# Patient Record
Sex: Female | Born: 1990 | Race: White | Hispanic: Yes | Marital: Married | Smoking: Never smoker
Health system: Southern US, Community
[De-identification: ages and names within clinical notes are randomized; demographics above are authoritative.]

## PROBLEM LIST (undated history)

## (undated) DIAGNOSIS — J45909 Unspecified asthma, uncomplicated: Secondary | ICD-10-CM

## (undated) HISTORY — PX: APPENDECTOMY: SHX54

## (undated) HISTORY — DX: Unspecified asthma, uncomplicated: J45.909

---

## 2017-11-13 ENCOUNTER — Emergency Department: Payer: Self-pay

## 2017-11-13 ENCOUNTER — Other Ambulatory Visit: Payer: Self-pay

## 2017-11-13 ENCOUNTER — Emergency Department
Admission: EM | Admit: 2017-11-13 | Discharge: 2017-11-14 | Disposition: A | Discharge status: Home / Self Care | Payer: Self-pay | Attending: Emergency Medicine | Admitting: Emergency Medicine

## 2017-11-13 DIAGNOSIS — I309 Acute pericarditis, unspecified: Secondary | ICD-10-CM | POA: Insufficient documentation

## 2017-11-13 DIAGNOSIS — I3139 Other pericardial effusion (noninflammatory): Secondary | ICD-10-CM

## 2017-11-13 DIAGNOSIS — I313 Pericardial effusion (noninflammatory): Secondary | ICD-10-CM

## 2017-11-13 LAB — BASIC METABOLIC PANEL
ANION GAP: 10 (ref 5–15)
BUN: 16 mg/dL (ref 6–20)
CO2: 21 mmol/L — AB (ref 22–32)
Calcium: 9.6 mg/dL (ref 8.9–10.3)
Chloride: 106 mmol/L (ref 98–111)
Creatinine, Ser: 0.86 mg/dL (ref 0.44–1.00)
Glucose, Bld: 111 mg/dL — ABNORMAL HIGH (ref 70–99)
Potassium: 3.4 mmol/L — ABNORMAL LOW (ref 3.5–5.1)
SODIUM: 137 mmol/L (ref 135–145)

## 2017-11-13 LAB — HEPATIC FUNCTION PANEL
ALBUMIN: 4.9 g/dL (ref 3.5–5.0)
ALK PHOS: 63 U/L (ref 38–126)
ALT: 31 U/L (ref 0–44)
AST: 33 U/L (ref 15–41)
Bilirubin, Direct: 0.3 mg/dL — ABNORMAL HIGH (ref 0.0–0.2)
Indirect Bilirubin: 2.4 mg/dL — ABNORMAL HIGH (ref 0.3–0.9)
TOTAL PROTEIN: 8.8 g/dL — AB (ref 6.5–8.1)
Total Bilirubin: 2.7 mg/dL — ABNORMAL HIGH (ref 0.3–1.2)

## 2017-11-13 LAB — CBC
HEMATOCRIT: 42.6 % (ref 35.0–47.0)
HEMOGLOBIN: 14.8 g/dL (ref 12.0–16.0)
MCH: 32.1 pg (ref 26.0–34.0)
MCHC: 34.7 g/dL (ref 32.0–36.0)
MCV: 92.4 fL (ref 80.0–100.0)
Platelets: 260 10*3/uL (ref 150–440)
RBC: 4.61 MIL/uL (ref 3.80–5.20)
RDW: 13.4 % (ref 11.5–14.5)
WBC: 9.1 10*3/uL (ref 3.6–11.0)

## 2017-11-13 LAB — LIPASE, BLOOD: LIPASE: 31 U/L (ref 11–51)

## 2017-11-13 LAB — TROPONIN I: Troponin I: <0.03 ng/mL (ref <0.03)

## 2017-11-13 MED ORDER — GI COCKTAIL ~~LOC~~
30.0000 mL | Freq: Once | ORAL | Status: AC
  Administered 2017-11-13: 30 mL via ORAL
  Filled 2017-11-13: qty 30

## 2017-11-13 MED ORDER — FENTANYL CITRATE (PF) 100 MCG/2ML IJ SOLN
75.0000 ug | Freq: Once | INTRAMUSCULAR | Status: AC
  Administered 2017-11-14: 75 ug via INTRAVENOUS
  Filled 2017-11-13: qty 2

## 2017-11-13 MED ORDER — SUCRALFATE 1 G PO TABS
1.0000 g | ORAL_TABLET | Freq: Once | ORAL | Status: AC
  Administered 2017-11-13: 1 g via ORAL
  Filled 2017-11-13: qty 1

## 2017-11-13 NOTE — ED Triage Notes (Signed)
Through the Spanish interpretor, patient states she started yesterday with chest pain associated with N/V and shortness of breath. She has not vomited but felt like she has to. Patient is alert and oriented and able to answer questions without difficulty

## 2017-11-13 NOTE — ED Provider Notes (Signed)
[facility] Emergency Department Provider Note  ____________________________________________   First MD Initiated Contact with Patient 11/13/17 2315     (approximate)  I have reviewed the triage vital signs and the nursing notes.   HISTORY  Chief Complaint Chest Pain    HPI Tamara Leach is a 27 y.o. female who presents to the emergency department with roughly 24 hours of chest pain, abdominal cramping, and 10 episodes of loose watery stools.  The symptoms seem to begin shortly after taking an over-the-counter weight loss supplement called Demograss.   Her chest pain is moderate severity aching in her left upper chest.  Worse when lying flat improves when sitting up associated with some shortness of breath.  She is also had cramping lower abdominal pain with 10 loose stools.  No recent antibiotics.  No fevers or chills.  No recent travel.  She has no leg swelling.  No history of DVT or pulmonary embolism.  No recent upper respiratory tract infections.  No history of tuberculosis.   History reviewed. No pertinent past medical history.  There are no active problems to display for this patient.   History reviewed. No pertinent surgical history.  Prior to Admission medications   Medication Sig Start Date End Date Taking? Authorizing Provider  ibuprofen (ADVIL,MOTRIN) 600 MG tablet Take 1 tablet (600 mg total) by mouth every 8 (eight) hours as needed. 11/14/17   Merrily Brittle, MD    Allergies Patient has no known allergies.  No family history on file.  Social History Social History   Tobacco Use  . Smoking status: Not on file  Substance Use Topics  . Alcohol use: Not on file  . Drug use: Not on file    Review of Systems Constitutional: No fever/chills Eyes: No visual changes. ENT: No sore throat. Cardiovascular: Positive for chest pain. Respiratory: Positive for shortness of breath. Gastrointestinal: Positive for abdominal pain.   No nausea, no vomiting.  Positive for diarrhea.  No constipation. Genitourinary: Negative for dysuria. Musculoskeletal: Negative for back pain. Skin: Negative for rash. Neurological: Negative for headaches, focal weakness or numbness.   ____________________________________________   PHYSICAL EXAM:  VITAL SIGNS: ED Triage Vitals  Enc Vitals Group     BP 11/13/17 2213 111/78     Pulse Rate 11/13/17 2230 81     Resp 11/13/17 2213 18     Temp 11/13/17 2213 98.9 F (37.2 C)     Temp src --      SpO2 11/13/17 2213 100 %     Weight 11/13/17 2214 188 lb (85.3 kg)     Height 11/13/17 2214 5\' 4"  (1.626 m)     Head Circumference --      Peak Flow --      Pain Score 11/13/17 2213 10     Pain Loc --      Pain Edu? --      Excl. in GC? --     Constitutional: Alert and oriented x4 appears uncomfortable holding her chest nontoxic no diaphoresis speaks full clear sentences Eyes: PERRL EOMI. Head: Atraumatic. Nose: No congestion/rhinnorhea. Mouth/Throat: No trismus Neck: No stridor.   Cardiovascular: Normal rate, regular rhythm. Grossly normal heart sounds.  Good peripheral circulation. Respiratory: Normal respiratory effort.  No retractions. Lungs CTAB and moving good air Gastrointestinal: Obese soft nontender Musculoskeletal: No lower extremity edema   Neurologic:  Normal speech and language. No gross focal neurologic deficits are appreciated. Skin:  Skin is warm, dry and intact.  No rash noted.  Specifically no zoster rash noted Psychiatric: Mood and affect are normal. Speech and behavior are normal.    ____________________________________________   DIFFERENTIAL includes but not limited to  Pericarditis, myocarditis, acute coronary syndrome, aortic dissection, pneumothorax, pulmonary embolism ____________________________________________   LABS (all labs ordered are listed, but only abnormal results are displayed)  Labs Reviewed  BASIC METABOLIC PANEL - Abnormal; Notable for  the following components:      Result Value   Potassium 3.4 (*)    CO2 21 (*)    Glucose, Bld 111 (*)    All other components within normal limits  HEPATIC FUNCTION PANEL - Abnormal; Notable for the following components:   Total Protein 8.8 (*)    Total Bilirubin 2.7 (*)    Bilirubin, Direct 0.3 (*)    Indirect Bilirubin 2.4 (*)    All other components within normal limits  CBC  TROPONIN I  LIPASE, BLOOD  HCG, QUANTITATIVE, PREGNANCY  TROPONIN I  URINALYSIS, COMPLETE (UACMP) WITH MICROSCOPIC  POC URINE PREG, ED    Lab work reviewed by me with no signs of acute ischemia x2 __________________________________________  EKG  ED ECG REPORT I, Merrily BrittleNeil Keira Bohlin, the attending physician, personally viewed and interpreted this ECG.  Date: 11/14/2017 EKG Time:  Rate: 84 Rhythm: normal sinus rhythm QRS Axis: Rightward axis Intervals: normal ST/T Wave abnormalities: normal Narrative Interpretation: no evidence of acute ischemia.  S1Q3T3 with some concern for right heart strain  ____________________________________________  RADIOLOGY  Chest x-ray reviewed by me with no acute disease CT chest reviewed by me with no evidence of pulmonary embolism but does suggest a small pericardial effusion ____________________________________________   PROCEDURES  Procedure(s) performed: no  Procedures  Critical Care performed: no  ____________________________________________   INITIAL IMPRESSION / ASSESSMENT AND PLAN / ED COURSE  Pertinent labs & imaging results that were available during my care of the patient were reviewed by me and considered in my medical decision making (see chart for details).        ----------------------------------------- 11:58 PM on 11/13/2017 -----------------------------------------  The patient's symptoms are unchanged after sucralfate and a GI cocktail.  She does use birth control and she is morbidly obese raising the probability of pulmonary  embolism.  As if no other clear source of her symptoms we will give her IV fentanyl 75 mcg and CT her chest.  2-hour troponin is well now. ____________________________________________  ----------------------------------------- 1:47 AM on 11/14/2017 -----------------------------------------  The patient CT scan is negative for pulmonary embolism but does show a small pericardial effusion.  Given 50 mg of Toradol with complete resolution of her symptoms.  On further questioning the patient does say her chest pain is significantly worsened with lying flat improves while sitting up.  Her EKG is not diagnostic for pericarditis however her history and pericardial effusion are so we will treat her with 1 week of nonsteroidals and refer her to cardiology as an outpatient.  Strict return precautions have been given and the patient verbalizes understanding and agreement with the plan.  FINAL CLINICAL IMPRESSION(S) / ED DIAGNOSES  Final diagnoses:  Sullivan LoneGilbert syndrome  Pericardial effusion  Acute pericarditis, unspecified type      NEW MEDICATIONS STARTED DURING THIS VISIT:  New Prescriptions   IBUPROFEN (ADVIL,MOTRIN) 600 MG TABLET    Take 1 tablet (600 mg total) by mouth every 8 (eight) hours as needed.     Note:  This document was prepared using Dragon voice recognition software and may include unintentional dictation  errors.     Merrily Brittle, MD 11/14/17 2201

## 2017-11-13 NOTE — ED Notes (Signed)
Information obtained via Lynelle Smokemaria, stratus interpreter.

## 2017-11-14 ENCOUNTER — Encounter: Payer: Self-pay | Admitting: Radiology

## 2017-11-14 ENCOUNTER — Emergency Department: Payer: Self-pay

## 2017-11-14 LAB — HCG, QUANTITATIVE, PREGNANCY: hCG, Beta Chain, Quant, S: <1 m[IU]/mL (ref <5)

## 2017-11-14 LAB — TROPONIN I: Troponin I: <0.03 ng/mL (ref <0.03)

## 2017-11-14 MED ORDER — IOHEXOL 350 MG/ML SOLN
75.0000 mL | Freq: Once | INTRAVENOUS | Status: AC | PRN
  Administered 2017-11-14: 75 mL via INTRAVENOUS

## 2017-11-14 MED ORDER — KETOROLAC TROMETHAMINE 30 MG/ML IJ SOLN
15.0000 mg | Freq: Once | INTRAMUSCULAR | Status: AC
  Administered 2017-11-14: 15 mg via INTRAVENOUS
  Filled 2017-11-14: qty 1

## 2017-11-14 MED ORDER — IBUPROFEN 600 MG PO TABS: 600.0000 mg | ORAL_TABLET | Freq: Three times a day (TID) | ORAL | 0 refills | Status: AC | PRN

## 2017-11-14 NOTE — Discharge Instructions (Signed)
Please take ibuprofen 3 times a day for the next week and make an appointment to follow-up with the cardiologist for reevaluation.  Return to the emergency department sooner for any concerns whatsoever.  It was a pleasure to take care of you today, and thank you for coming to our emergency department.  If you have any questions or concerns before leaving please ask the nurse to grab me and I'm more than happy to go through your aftercare instructions again.  If you were prescribed any opioid pain medication today such as Norco, Vicodin, Percocet, morphine, hydrocodone, or oxycodone please make sure you do not drive when you are taking this medication as it can alter your ability to drive safely.  If you have any concerns once you are home that you are not improving or are in fact getting worse before you can make it to your follow-up appointment, please do not hesitate to call 911 and come back for further evaluation.  Merrily BrittleNeil Yerick Eggebrecht, MD  Results for orders placed or performed during the hospital encounter of 11/13/17  Basic metabolic panel  Result Value Ref Range   Sodium 137 135 - 145 mmol/L   Potassium 3.4 (L) 3.5 - 5.1 mmol/L   Chloride 106 98 - 111 mmol/L   CO2 21 (L) 22 - 32 mmol/L   Glucose, Bld 111 (H) 70 - 99 mg/dL   BUN 16 6 - 20 mg/dL   Creatinine, Ser 4.090.86 0.44 - 1.00 mg/dL   Calcium 9.6 8.9 - 81.110.3 mg/dL   GFR calc non Af Amer >60 >60 mL/min   GFR calc Af Amer >60 >60 mL/min   Anion gap 10 5 - 15  CBC  Result Value Ref Range   WBC 9.1 3.6 - 11.0 K/uL   RBC 4.61 3.80 - 5.20 MIL/uL   Hemoglobin 14.8 12.0 - 16.0 g/dL   HCT 91.442.6 78.235.0 - 95.647.0 %   MCV 92.4 80.0 - 100.0 fL   MCH 32.1 26.0 - 34.0 pg   MCHC 34.7 32.0 - 36.0 g/dL   RDW 21.313.4 08.611.5 - 57.814.5 %   Platelets 260 150 - 440 K/uL  Troponin I  Result Value Ref Range   Troponin I <0.03 <0.03 ng/mL  Hepatic function panel  Result Value Ref Range   Total Protein 8.8 (H) 6.5 - 8.1 g/dL   Albumin 4.9 3.5 - 5.0 g/dL   AST 33  15 - 41 U/L   ALT 31 0 - 44 U/L   Alkaline Phosphatase 63 38 - 126 U/L   Total Bilirubin 2.7 (H) 0.3 - 1.2 mg/dL   Bilirubin, Direct 0.3 (H) 0.0 - 0.2 mg/dL   Indirect Bilirubin 2.4 (H) 0.3 - 0.9 mg/dL  Lipase, blood  Result Value Ref Range   Lipase 31 11 - 51 U/L  hCG, quantitative, pregnancy  Result Value Ref Range   hCG, Beta Chain, Quant, S <1 <5 mIU/mL  Troponin I  Result Value Ref Range   Troponin I <0.03 <0.03 ng/mL   Dg Chest 2 View  Result Date: 11/13/2017 CLINICAL DATA:  Chest pain EXAM: CHEST - 2 VIEW COMPARISON:  None. FINDINGS: Normal heart size and mediastinal contours. No acute infiltrate or edema. No effusion or pneumothorax. No acute osseous findings. IMPRESSION: Negative chest. Electronically Signed   By: Marnee SpringJonathon  Watts M.D.   On: 11/13/2017 23:03   Ct Angio Chest Pe W/cm &/or Wo Cm  Result Date: 11/14/2017 CLINICAL DATA:  PE suspected, high pretest prob. Chest pain and  shortness of breath, onset yesterday. EXAM: CT ANGIOGRAPHY CHEST WITH CONTRAST TECHNIQUE: Multidetector CT imaging of the chest was performed using the standard protocol during bolus administration of intravenous contrast. Multiplanar CT image reconstructions and MIPs were obtained to evaluate the vascular anatomy. CONTRAST:  75mL OMNIPAQUE IOHEXOL 350 MG/ML SOLN COMPARISON:  Chest radiographs yesterday. FINDINGS: Cardiovascular: There are no filling defects within the pulmonary arteries to suggest pulmonary embolus. Thoracic aorta is normal in caliber without dissection or acute aortic abnormality. Heart is normal in size. Small pericardial effusion with fluid in the superior pericardial recess. Mediastinum/Nodes: No enlarged mediastinal or hilar lymph nodes. No visualized thyroid nodule. Esophagus is decompressed. Lungs/Pleura: Mild subsegmental atelectasis in the left greater than right lower lobe. No consolidative airspace disease. No pleural effusion or evidence pulmonary edema. The trachea and mainstem  bronchi are patent. No pulmonary mass. Upper Abdomen: No acute findings. Elongated left lobe of the liver extending into the left upper quadrant, partially included. Musculoskeletal: There are no acute or suspicious osseous abnormalities. Review of the MIP images confirms the above findings. IMPRESSION: 1. No pulmonary embolus. 2. Small pericardial effusion. Electronically Signed   By: Rubye Oaks M.D.   On: 11/14/2017 01:09

## 2018-04-20 DIAGNOSIS — L732 Hidradenitis suppurativa: Secondary | ICD-10-CM | POA: Insufficient documentation

## 2018-04-20 LAB — HM HIV SCREENING LAB: HM HIV Screening: NEGATIVE

## 2018-04-20 LAB — HM PAP SMEAR: HM Pap smear: NEGATIVE

## 2018-08-26 ENCOUNTER — Emergency Department
Admission: EM | Admit: 2018-08-26 | Discharge: 2018-08-26 | Disposition: A | Discharge status: Home / Self Care | Payer: Self-pay | Attending: Emergency Medicine | Admitting: Emergency Medicine

## 2018-08-26 ENCOUNTER — Other Ambulatory Visit: Payer: Self-pay

## 2018-08-26 ENCOUNTER — Emergency Department: Payer: Self-pay

## 2018-08-26 DIAGNOSIS — N200 Calculus of kidney: Secondary | ICD-10-CM | POA: Insufficient documentation

## 2018-08-26 LAB — URINALYSIS, COMPLETE (UACMP) WITH MICROSCOPIC
Bacteria, UA: NONE SEEN
Bilirubin Urine: NEGATIVE
Glucose, UA: NEGATIVE mg/dL
Hgb urine dipstick: NEGATIVE
Ketones, ur: NEGATIVE mg/dL
Leukocytes,Ua: NEGATIVE
Nitrite: NEGATIVE
Protein, ur: NEGATIVE mg/dL
Specific Gravity, Urine: 1.011 (ref 1.005–1.030)
pH: 7 (ref 5.0–8.0)

## 2018-08-26 LAB — POCT PREGNANCY, URINE: Preg Test, Ur: NEGATIVE

## 2018-08-26 MED ORDER — MELOXICAM 15 MG PO TABS: 15.0000 mg | ORAL_TABLET | Freq: Every day | ORAL | 1 refills | Status: AC

## 2018-08-26 MED ORDER — KETOROLAC TROMETHAMINE 30 MG/ML IJ SOLN
30.0000 mg | Freq: Once | INTRAMUSCULAR | Status: AC
  Administered 2018-08-26: 21:00:00 30 mg via INTRAMUSCULAR
  Filled 2018-08-26: qty 1

## 2018-08-26 MED ORDER — METHOCARBAMOL 500 MG PO TABS: 500.0000 mg | ORAL_TABLET | Freq: Three times a day (TID) | ORAL | 0 refills | Status: AC | PRN

## 2018-08-26 MED ORDER — TAMSULOSIN HCL 0.4 MG PO CAPS: 0.4000 mg | ORAL_CAPSULE | Freq: Every day | ORAL | 0 refills | Status: AC

## 2018-08-26 MED ORDER — METHOCARBAMOL 500 MG PO TABS
1000.0000 mg | ORAL_TABLET | Freq: Once | ORAL | Status: AC
  Administered 2018-08-26: 1000 mg via ORAL
  Filled 2018-08-26: qty 2

## 2018-08-26 NOTE — ED Provider Notes (Signed)
[facility] Emergency Department Provider Note  ____________________________________________  Time seen: Approximately 7:52 PM  I have reviewed the triage vital signs and the nursing notes.   HISTORY  Chief Complaint Back Pain    HPI Tamara Leach is a 28 y.o. female presents to the emergency department with left sided low back pain for 1 day in the absence of falls or traumas.  Patient rates her pain at 10 out of 10 intensity and states it does not radiate.  Patient states that she thought that she was running fever last night.  She denies dysuria or hematuria.  She has had a history of gallstones but denies a history of nephrolithiasis.  She denies a history of pyelonephritis or recent UTI.  No changes in vaginal discharge or dyspareunia.  Patient states that she has had similar episodes of back pain in the past that have resolved without intervention.  No other alleviating measures have been attempted.        No past medical history on file.  There are no active problems to display for this patient.   No past surgical history on file.  Prior to Admission medications   Medication Sig Start Date End Date Taking? Authorizing Provider  ibuprofen (ADVIL,MOTRIN) 600 MG tablet Take 1 tablet (600 mg total) by mouth every 8 (eight) hours as needed. 11/14/17   Merrily Brittle, MD  meloxicam (MOBIC) 15 MG tablet Take 1 tablet (15 mg total) by mouth daily for 7 days. 08/26/18 09/02/18  Orvil Feil, PA-C  methocarbamol (ROBAXIN) 500 MG tablet Take 1 tablet (500 mg total) by mouth every 8 (eight) hours as needed for up to 5 days. 08/26/18 08/31/18  Orvil Feil, PA-C  tamsulosin (FLOMAX) 0.4 MG CAPS capsule Take 1 capsule (0.4 mg total) by mouth daily for 14 days. 08/26/18 09/09/18  Orvil Feil, PA-C    Allergies Patient has no known allergies.  No family history on file.  Social History Social History   Tobacco Use  . Smoking status: Not on  file  Substance Use Topics  . Alcohol use: Not on file  . Drug use: Not on file     Review of Systems  Constitutional: Patient reports tactile fever.  Eyes: No visual changes. No discharge ENT: No upper respiratory complaints. Cardiovascular: no chest pain. Respiratory: no cough. No SOB. Gastrointestinal: No abdominal pain.  No nausea, no vomiting.  No diarrhea.  No constipation. Genitourinary: Negative for dysuria. No hematuria Musculoskeletal: Patient has low back pain.  Skin: Negative for rash, abrasions, lacerations, ecchymosis. Neurological: Negative for headaches, focal weakness or numbness.   ____________________________________________   PHYSICAL EXAM:  VITAL SIGNS: ED Triage Vitals  Enc Vitals Group     BP 08/26/18 1917 (!) 114/92     Pulse Rate 08/26/18 1916 72     Resp --      Temp 08/26/18 1916 98.5 F (36.9 C)     Temp Source 08/26/18 1916 Oral     SpO2 08/26/18 1917 96 %     Weight 08/26/18 1919 206 lb (93.4 kg)     Height --      Head Circumference --      Peak Flow --      Pain Score 08/26/18 1918 8     Pain Loc --      Pain Edu? --      Excl. in GC? --      Constitutional: Alert and oriented. Well appearing and  in no acute distress. Eyes: Conjunctivae are normal. PERRL. EOMI. Head: Atraumatic. Cardiovascular: Normal rate, regular rhythm. Normal S1 and S2.  Good peripheral circulation. Respiratory: Normal respiratory effort without tachypnea or retractions. Lungs CTAB. Good air entry to the bases with no decreased or absent breath sounds. Gastrointestinal: Bowel sounds 4 quadrants. Soft and nontender to palpation. No guarding or rigidity. No palpable masses. No distention.  Patient has CVA tenderness on the left. Musculoskeletal: Full range of motion to all extremities. No gross deformities appreciated. Neurologic:  Normal speech and language. No gross focal neurologic deficits are appreciated.  Skin:  Skin is warm, dry and intact. No rash  noted. Psychiatric: Mood and affect are normal. Speech and behavior are normal. Patient exhibits appropriate insight and judgement.   ____________________________________________   LABS (all labs ordered are listed, but only abnormal results are displayed)  Labs Reviewed  URINALYSIS, COMPLETE (UACMP) WITH MICROSCOPIC - Abnormal; Notable for the following components:      Result Value   Color, Urine YELLOW (*)    APPearance HAZY (*)    All other components within normal limits  POCT PREGNANCY, URINE   ____________________________________________  EKG   ____________________________________________  RADIOLOGY I personally viewed and evaluated these images as part of my medical decision making, as well as reviewing the written report by the radiologist.    Ct Renal Stone Study  Result Date: 08/26/2018 CLINICAL DATA:  28 year old female with acute flank and abdominal pain. EXAM: CT ABDOMEN AND PELVIS WITHOUT CONTRAST TECHNIQUE: Multidetector CT imaging of the abdomen and pelvis was performed following the standard protocol without IV contrast. COMPARISON:  None. FINDINGS: Please note that parenchymal abnormalities may be missed without intravenous contrast. Lower chest: No acute abnormality Hepatobiliary: Severe hepatic steatosis noted. The gallbladder is unremarkable. No biliary dilatation. Pancreas: Unremarkable Spleen: Unremarkable Adrenals/Urinary Tract: The kidneys, adrenal glands and bladder are unremarkable except for a nonobstructing 3 mm mid LEFT renal calculus. Stomach/Bowel: Stomach is within normal limits. Appendix appears normal. No evidence of bowel wall thickening, distention, or inflammatory changes. Vascular/Lymphatic: No significant vascular findings are present. No enlarged abdominal or pelvic lymph nodes. Reproductive: An IUD is noted.  No adnexal mass. Other: No ascites, focal collection or pneumoperitoneum. Musculoskeletal: No acute or significant osseous findings.  IMPRESSION: 1. No evidence of acute abnormality. 2. 3 mm nonobstructing LEFT renal calculus 3. Severe hepatic steatosis Electronically Signed   By: Harmon Pier M.D.   On: 08/26/2018 20:27    ____________________________________________    PROCEDURES  Procedure(s) performed:    Procedures    Medications  ketorolac (TORADOL) 30 MG/ML injection 30 mg (30 mg Intramuscular Given 08/26/18 2100)  methocarbamol (ROBAXIN) tablet 1,000 mg (1,000 mg Oral Given 08/26/18 2100)     ____________________________________________   INITIAL IMPRESSION / ASSESSMENT AND PLAN / ED COURSE  Pertinent labs & imaging results that were available during my care of the patient were reviewed by me and considered in my medical decision making (see chart for details).  Review of the [place] CSRS was performed in accordance of the [facility] prior to dispensing any controlled drugs.           Assessment and Plan:  Nephrolithiasis Back pain Patient presents to the emergency department with left-sided back pain for 1 day without dysuria, hematuria or increased urinary frequency.  On physical exam, patient has CVA tenderness on the left without abdominal pain, guarding or rigidity.  Differential diagnosis included pyelonephritis, cystitis, nephrolithiasis and lumbar strain.  Urinalysis revealed no concerning  findings of infection without leukocytes, nitrites or blood.  CT renal stone study revealed a 3 mm nonobstructing left-sided renal stone.  Patient was discharged with Flomax, Robaxin and meloxicam.  Patient education regarding nephrolithiasis was given.  Increase hydration at home was encouraged.  Patient was given strict return precautions to return to the emergency department for new or worsening symptoms.  All patient questions were answered.   ____________________________________________  FINAL CLINICAL IMPRESSION(S) / ED DIAGNOSES  Final diagnoses:  Nephrolithiasis      NEW MEDICATIONS STARTED  DURING THIS VISIT:  ED Discharge Orders         Ordered    tamsulosin (FLOMAX) 0.4 MG CAPS capsule  Daily     08/26/18 2118    methocarbamol (ROBAXIN) 500 MG tablet  Every 8 hours PRN     08/26/18 2118    meloxicam (MOBIC) 15 MG tablet  Daily     08/26/18 2118              This chart was dictated using voice recognition software/Dragon. Despite best efforts to proofread, errors can occur which can change the meaning. Any change was purely unintentional.    Orvil FeilWoods, Noor Vidales M, PA-C 08/26/18 2150    Nita SickleVeronese, [place], MD 08/26/18 [phone]

## 2018-08-26 NOTE — ED Triage Notes (Signed)
Back pain with dysuria and fever for 3 days.

## 2018-08-26 NOTE — ED Notes (Addendum)
Patient reports pain in lower back since yesterday. Patient further reports that she has had this pain before. She states that the pain is not the result of a fall. Patient is currently on her way for a CT scan.

## 2019-04-12 DIAGNOSIS — L732 Hidradenitis suppurativa: Secondary | ICD-10-CM

## 2019-04-13 ENCOUNTER — Other Ambulatory Visit: Payer: Self-pay

## 2019-04-13 ENCOUNTER — Ambulatory Visit: Payer: Self-pay | Admitting: Advanced Practice Midwife

## 2019-04-13 ENCOUNTER — Ambulatory Visit: Payer: Self-pay

## 2019-04-13 ENCOUNTER — Encounter: Payer: Self-pay | Admitting: Advanced Practice Midwife

## 2019-04-13 DIAGNOSIS — Z30430 Encounter for insertion of intrauterine contraceptive device: Secondary | ICD-10-CM | POA: Insufficient documentation

## 2019-04-13 DIAGNOSIS — Z113 Encounter for screening for infections with a predominantly sexual mode of transmission: Secondary | ICD-10-CM

## 2019-04-13 LAB — WET PREP FOR TRICH, YEAST, CLUE
Trichomonas Exam: NEGATIVE
Yeast Exam: NEGATIVE

## 2019-04-13 NOTE — Progress Notes (Addendum)
Wet mount reviewed and is negative today, so no treatment needed for wet mount per standing order. PCP list given to pt per pt request and provider order. Counseled pt per provider orders and pt states understanding. Pt reports she will call back to set up appt for physical. Provider orders completed.Ronny Bacon, RN

## 2019-04-13 NOTE — Progress Notes (Signed)
STI clinic/screening visit  Subjective:  Tamara Leach is a 28 y.o.G4P2 nonsmoker female being seen today for an STI screening visit. The patient reports they do have symptoms.  Patient reports that they do not desire a pregnancy in the next year.   They reported they are not interested in discussing contraception today.   Patient has the following medical conditions:   Patient Active Problem List   Diagnosis Date Noted  . Hidradenitis suppurativa 04/20/2018  . Morbid obesity (HCC) 04/20/2018     No chief complaint on file.   HPI  Patient reports voids and sees "things" in urine; left ear hurts x 1 week; LMP 02/15/19 with Paraguard inserted 07/25/18 Last sex 04/11/19 without condom.  2 sex partners in last 3 months.  See flowsheet for further details and programmatic requirements.    The following portions of the patient's history were reviewed and updated as appropriate: allergies, current medications, past medical history, past social history, past surgical history and problem list.  Objective:  There were no vitals filed for this visit.  Physical Exam Vitals signs and nursing note reviewed.  Constitutional:      Appearance: Normal appearance.  HENT:     Head: Normocephalic and atraumatic.     Mouth/Throat:     Mouth: Mucous membranes are moist.     Pharynx: Oropharynx is clear. No oropharyngeal exudate or posterior oropharyngeal erythema.  Pulmonary:     Effort: Pulmonary effort is normal.  Abdominal:     General: Abdomen is flat.     Palpations: Abdomen is soft. There is no mass.     Tenderness: There is no abdominal tenderness. There is no rebound.     Comments: Poor tone, soft without tenderness, increased adipose  Genitourinary:    General: Normal vulva.     Exam position: Lithotomy position.     Pubic Area: No rash or pubic lice.      Labia:        Right: No rash or lesion.        Left: No rash or lesion.      Vagina: Normal. No vaginal  discharge, erythema, bleeding or lesions.     Cervix: No cervical motion tenderness, discharge, friability, lesion or erythema.     Uterus: Normal.      Adnexa: Right adnexa normal and left adnexa normal.     Rectum: Normal.  Lymphadenopathy:     Head:     Right side of head: No preauricular or posterior auricular adenopathy.     Left side of head: No preauricular or posterior auricular adenopathy.     Cervical: No cervical adenopathy.     Upper Body:     Right upper body: No supraclavicular or axillary adenopathy.     Left upper body: No supraclavicular or axillary adenopathy.     Lower Body: No right inguinal adenopathy. No left inguinal adenopathy.  Skin:    General: Skin is warm and dry.     Findings: No rash.  Neurological:     Mental Status: She is alert and oriented to person, place, and time.       Assessment and Plan:  Tamara Leach is a 28 y.o. female presenting to the [facility] Department for STI screening  1. Screening examination for venereal disease Treat wet mount per standing orders Immunization nurse consult Please give primary care list to pt for her ear pain Last physical 04/2018--needs physical CBE due 2022 -  WET PREP FOR TRICH, YEAST, CLUE - Syphilis Serology, [facility]     No follow-ups on file.  No future appointments.  Herbie Saxon, CNM

## 2019-08-05 ENCOUNTER — Ambulatory Visit: Payer: Self-pay | Attending: Internal Medicine

## 2019-08-05 DIAGNOSIS — Z23 Encounter for immunization: Secondary | ICD-10-CM

## 2019-08-05 NOTE — Progress Notes (Signed)
   Covid-19 Vaccination Clinic  Name:  Clydette Privitera    MRN: [id number] DOB: [birth year]  08/05/2019  Ms. Morgan Keinath was observed post Covid-19 immunization for 15 minutes without incident. She was provided with Vaccine Information Sheet and instruction to access the V-Safe system.   Ms. Ahrianna Siglin was instructed to call 911 with any severe reactions post vaccine: Marland Kitchen Difficulty breathing  . Swelling of face and throat  . A fast heartbeat  . A bad rash all over body  . Dizziness and weakness   Immunizations Administered    Name Date Dose VIS Date Route   Pfizer COVID-19 Vaccine 08/05/2019  9:19 AM 0.3 mL 04/20/2019 Intramuscular   Manufacturer: [employer], [employer]   Lot: [id number]   NDC: [address]-5

## 2019-08-26 ENCOUNTER — Ambulatory Visit: Payer: Self-pay | Attending: Internal Medicine

## 2019-08-26 DIAGNOSIS — Z23 Encounter for immunization: Secondary | ICD-10-CM

## 2019-08-26 NOTE — Progress Notes (Signed)
   Covid-19 Vaccination Clinic  Name:  Tamara Leach    MRN: [id number] DOB: 11/22/1990  08/26/2019  Tamara Leach was observed post Covid-19 immunization for 15 minutes without incident. She was provided with Vaccine Information Sheet and instruction to access the V-Safe system.   Tamara Leach was instructed to call 911 with any severe reactions post vaccine: . Difficulty breathing  . Swelling of face and throat  . A fast heartbeat  . A bad rash all over body  . Dizziness and weakness   Immunizations Administered    Name Date Dose VIS Date Route   Pfizer COVID-19 Vaccine 08/26/2019  9:15 AM 0.3 mL 04/20/2019 Intramuscular   Manufacturer: Pfizer, Inc   Lot: [id number]   NDC: [address]-2     

## 2019-08-26 NOTE — Progress Notes (Signed)
   Covid-19 Vaccination Clinic  Name:  Shellene Sweigert    MRN: [id number] DOB: Dec 24, 1990  08/26/2019  Ms. Keaunna Skipper was observed post Covid-19 immunization for 15 minutes without incident. She was provided with Vaccine Information Sheet and instruction to access the V-Safe system.   Ms. Swayzee Wadley was instructed to call 911 with any severe reactions post vaccine: Marland Kitchen Difficulty breathing  . Swelling of face and throat  . A fast heartbeat  . A bad rash all over body  . Dizziness and weakness   Immunizations Administered    Name Date Dose VIS Date Route   Pfizer COVID-19 Vaccine 08/26/2019  9:15 AM 0.3 mL 04/20/2019 Intramuscular   Manufacturer: [employer], [employer]   Lot: [id number]   NDC: [address]-8

## 2020-07-07 IMAGING — CT CT RENAL STONE PROTOCOL
2 of 4 series · 17 of 46 positions shown, 19 images · non-contrast
Comparison: None.

CLINICAL DATA: 28-year-old female with acute flank and abdominal
pain.

EXAM:
CT ABDOMEN AND PELVIS WITHOUT CONTRAST
TECHNIQUE: Multidetector CT imaging of the abdomen and pelvis was performed
following the standard protocol without IV contrast.

[Series 2: stone full standard · axial · 0.82mm/px · z∈[-1258,-838]mm · 14 of 94 slices shown, 16 images]
[im 5/94  soft-tissue]
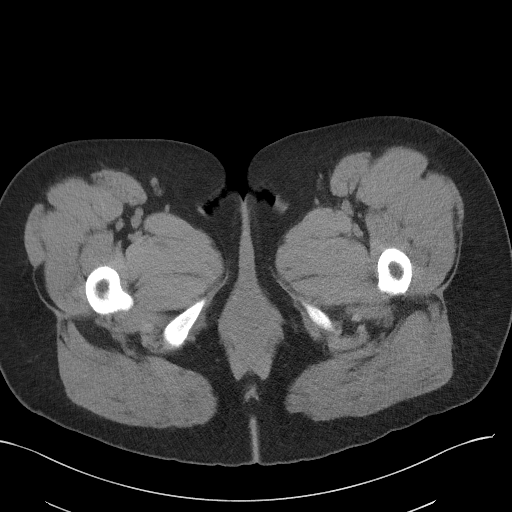
[im 5/94  bone]
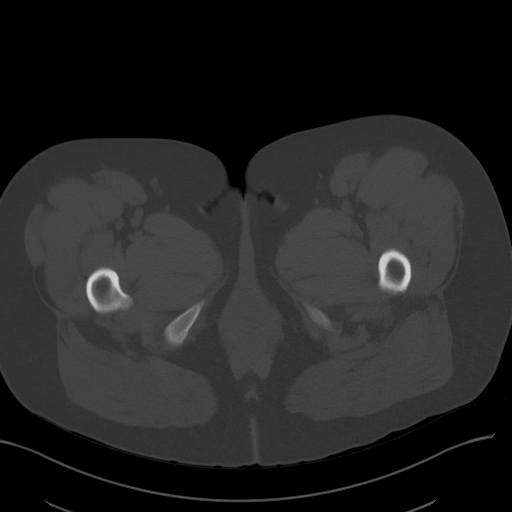
[im 13/94  soft-tissue]
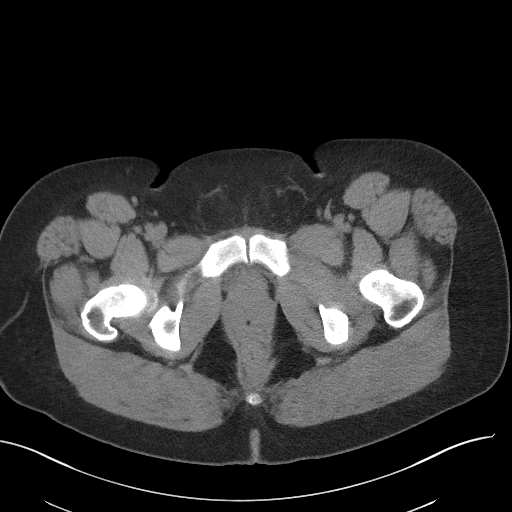
[im 17/94  soft-tissue]
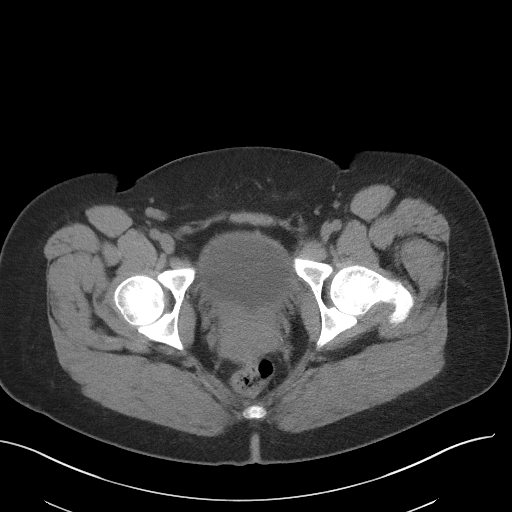
[im 25/94  soft-tissue]
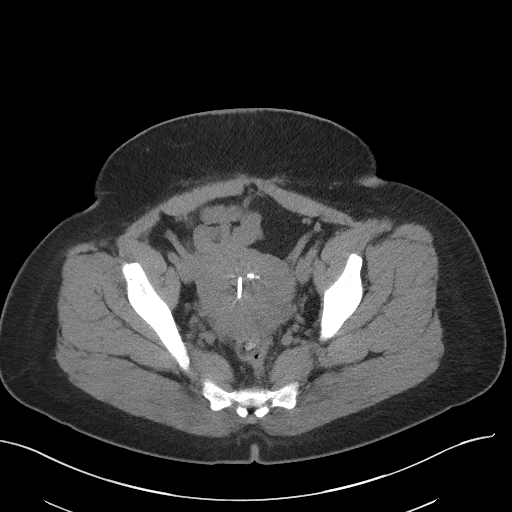
[im 33/94  soft-tissue]
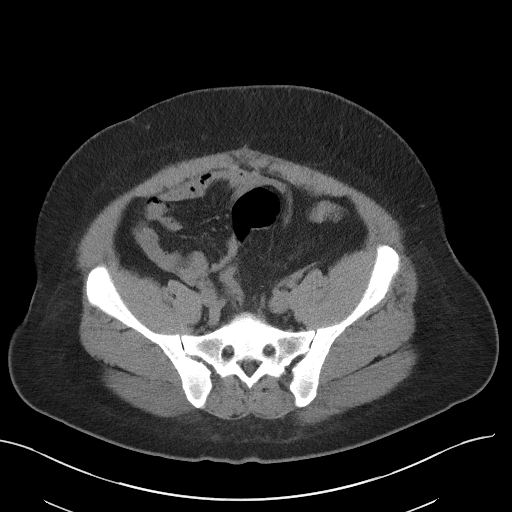
[im 37/94  soft-tissue]
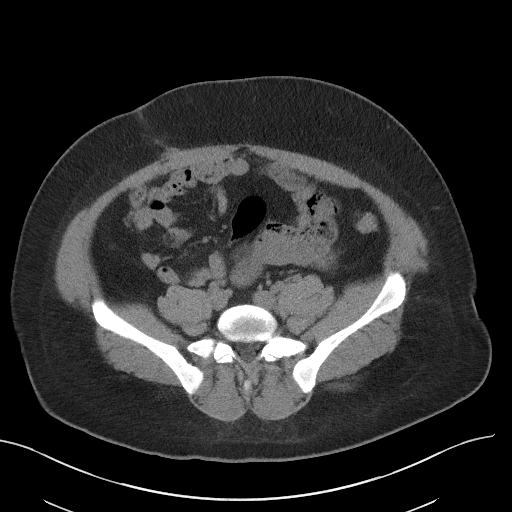
[im 45/94  soft-tissue]
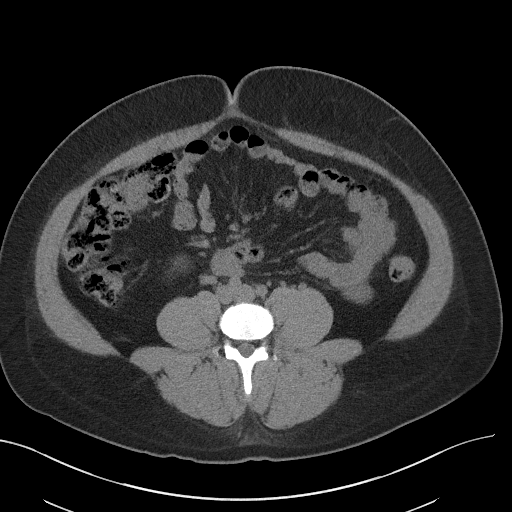
[im 49/94  soft-tissue]
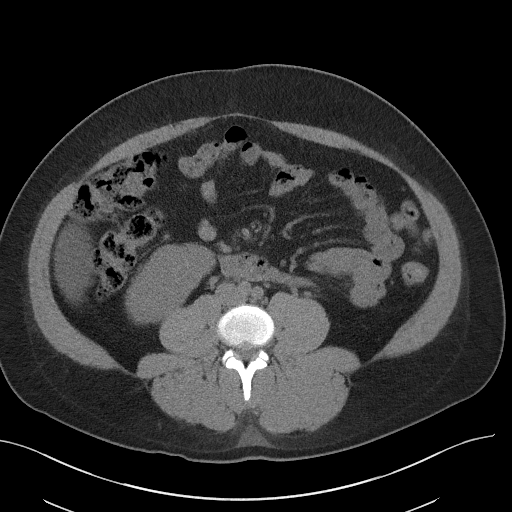
[im 57/94  soft-tissue]
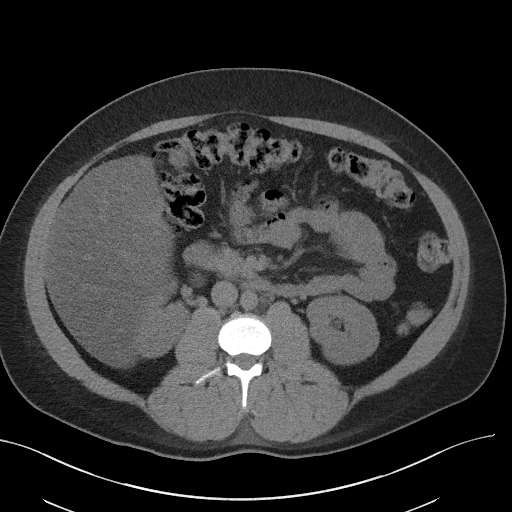
[im 57/94  bone]
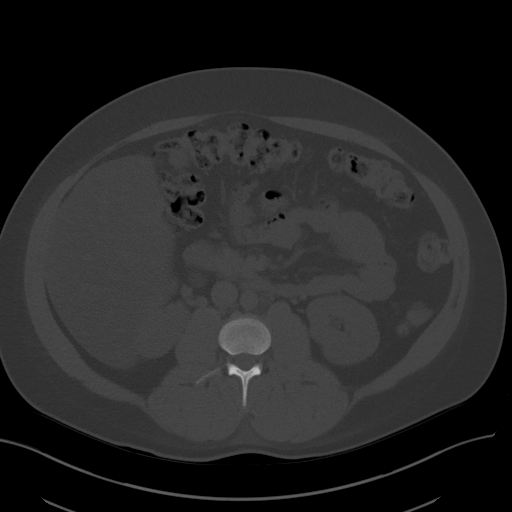
[im 61/94  soft-tissue]
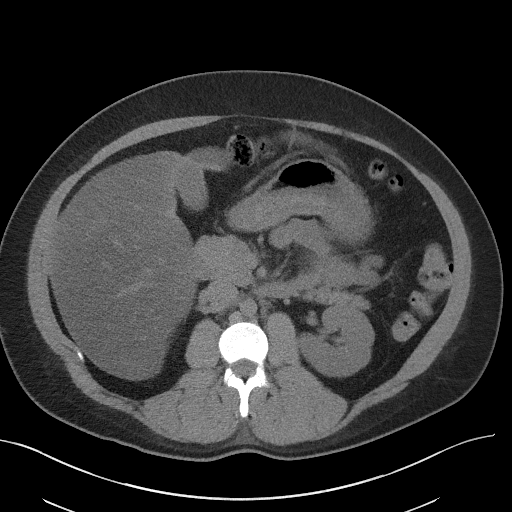
[im 69/94  soft-tissue]
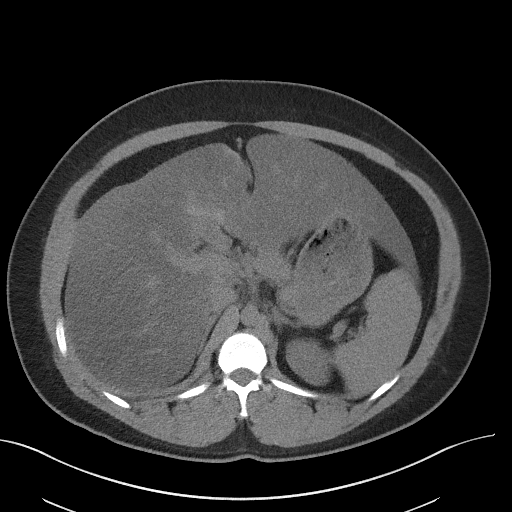
[im 77/94  soft-tissue]
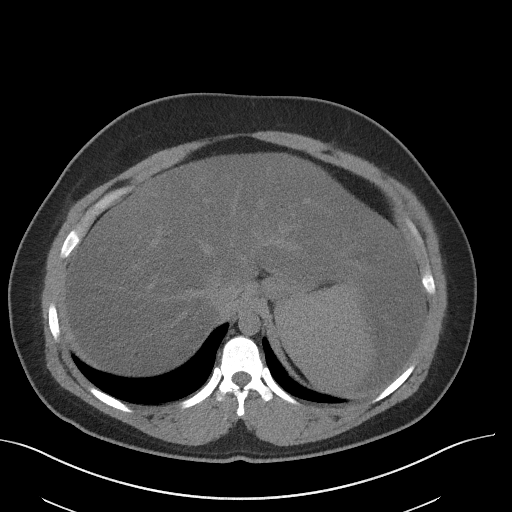
[im 81/94  soft-tissue]
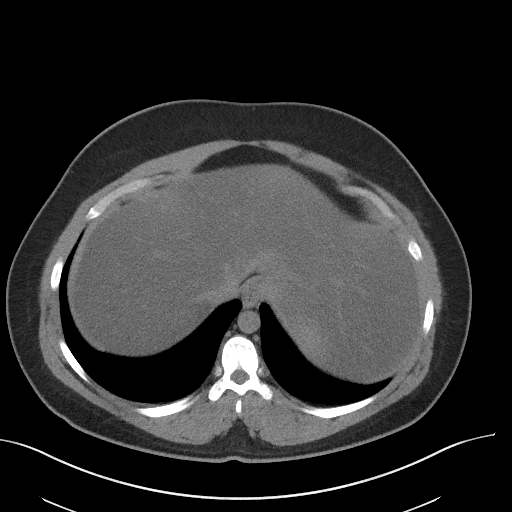
[im 89/94  soft-tissue]
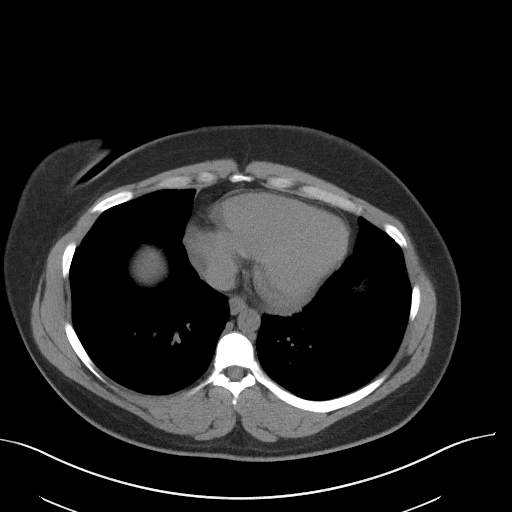

[Series 5: coronal · coronal · 0.82mm/px · 3 of 158 slices shown]
[im 53/158  soft-tissue]
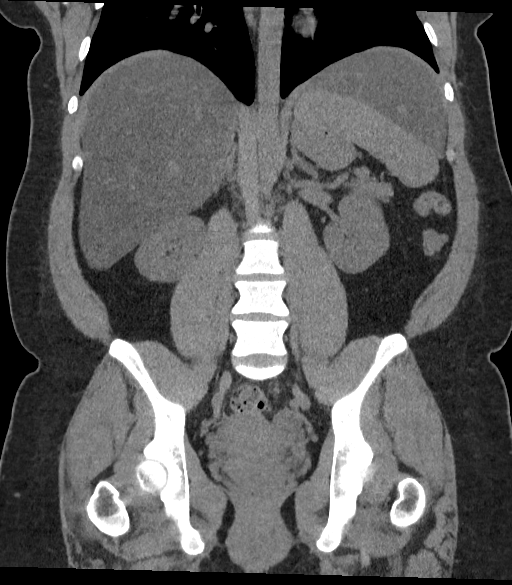
[im 70/158  soft-tissue]
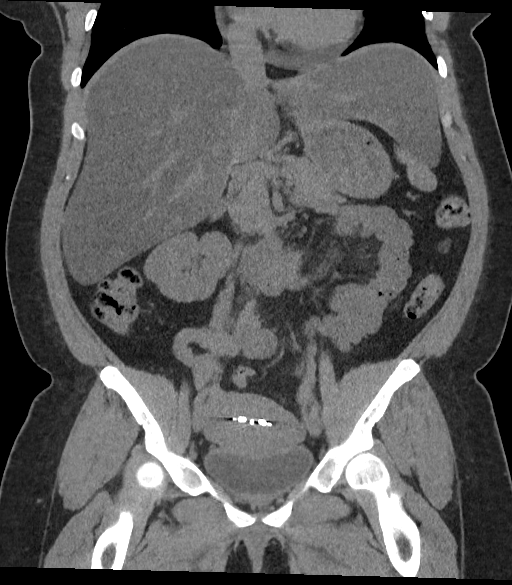
[im 88/158  soft-tissue]
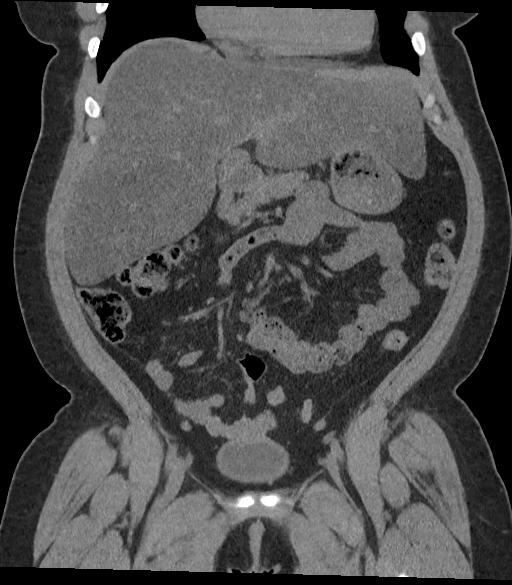

[17 of 46 positions shown; findings below may reference images not displayed]

FINDINGS: Please note that parenchymal abnormalities may be missed without
intravenous contrast.

Lower chest: No acute abnormality

Hepatobiliary: Severe hepatic steatosis noted. The gallbladder is
unremarkable. No biliary dilatation.

Pancreas: Unremarkable

Spleen: Unremarkable

Adrenals/Urinary Tract: The kidneys, adrenal glands and bladder are
unremarkable except for a nonobstructing 3 mm mid LEFT renal
calculus.

Stomach/Bowel: Stomach is within normal limits. Appendix appears
normal. No evidence of bowel wall thickening, distention, or
inflammatory changes.

Vascular/Lymphatic: No significant vascular findings are present. No
enlarged abdominal or pelvic lymph nodes.

Reproductive: An IUD is noted.  No adnexal mass.

Other: No ascites, focal collection or pneumoperitoneum.

Musculoskeletal: No acute or significant osseous findings.
IMPRESSION: 1. No evidence of acute abnormality.
2. 3 mm nonobstructing LEFT renal calculus
3. Severe hepatic steatosis

## 2021-06-25 ENCOUNTER — Ambulatory Visit: Payer: Self-pay

## 2021-07-02 ENCOUNTER — Other Ambulatory Visit: Payer: Self-pay

## 2021-07-02 ENCOUNTER — Encounter: Payer: Self-pay | Admitting: Physician Assistant

## 2021-07-02 ENCOUNTER — Ambulatory Visit (LOCAL_COMMUNITY_HEALTH_CENTER): Payer: Self-pay | Admitting: Physician Assistant

## 2021-07-02 VITALS — BP 104/68 | Ht 60.25 in | Wt 202.6 lb

## 2021-07-02 DIAGNOSIS — Z1331 Encounter for screening for depression: Secondary | ICD-10-CM

## 2021-07-02 DIAGNOSIS — Z01419 Encounter for gynecological examination (general) (routine) without abnormal findings: Secondary | ICD-10-CM

## 2021-07-02 DIAGNOSIS — L819 Disorder of pigmentation, unspecified: Secondary | ICD-10-CM | POA: Insufficient documentation

## 2021-07-02 DIAGNOSIS — Z3009 Encounter for other general counseling and advice on contraception: Secondary | ICD-10-CM

## 2021-07-02 LAB — WET PREP FOR TRICH, YEAST, CLUE
Trichomonas Exam: NEGATIVE
Yeast Exam: NEGATIVE

## 2021-07-02 LAB — HM HIV SCREENING LAB: HM HIV Screening: NEGATIVE

## 2021-07-02 NOTE — Progress Notes (Signed)
[facility] DEPARTMENT [facility] [address] Main Number: [phone]  Family Planning Visit- Initial Visit  Subjective:  Tamara Leach is a 31 y.o.  [id number]   being seen today for an initial annual visit and to discuss reproductive life planning.  The patient is currently using IUD or IUS for pregnancy prevention. Patient reports   does not want a pregnancy in the next year.  Patient has the following medical conditions has Hidradenitis suppurativa; Morbid obesity (HCC); Paraguard inserted 07/25/18; and Positive screening for depression on 2-item Patient Health Questionnaire (PHQ-2) on their problem list.  Chief Complaint  Patient presents with   Annual Exam   Patient reports complaints as listed on ROS. Wants to continue IUD, considering next pregnancy in 2024.  Patient denies SI/HI.   Body mass index is 39.24 kg/m. - Patient is eligible for diabetes screening based on BMI and age >56?  no HA1C ordered? no  Patient reports 2  partner/s in last year. Desires STI screening?  Yes  Has patient been screened once for HCV in the past?  No  No results found for: HCVAB  Does the patient have current drug use (including MJ), have a partner with drug use, and/or has been incarcerated since last result? No  If yes-- Screen for HCV through [facility]   Does the patient meet criteria for HBV testing? No  Criteria:  -Household, sexual or needle sharing contact with HBV -History of drug use -HIV positive -Those with known Hep C   Health Maintenance Due  Topic Date Due   Hepatitis C Screening  Never done   TETANUS/TDAP  Never done   COVID-19 Vaccine (3 - Booster for Pfizer series) 10/21/2019   INFLUENZA VACCINE  Never done   PAP SMEAR-Modifier  04/20/2021    Review of Systems  Constitutional:  Positive for weight loss.       Intentional wt loss of 20 lbs over last 8 mo via diet/exercise  HENT: Negative.    Eyes:  Positive  for blurred vision and pain.       Occ L eye pain and persistent blurriness  Respiratory: Negative.    Cardiovascular: Negative.   Gastrointestinal: Negative.   Genitourinary:        White itchy vag discharge for 1 mo  Musculoskeletal:        Bilateral lower leg cramping and hip pain with prolonged standing at job  Skin:        Irreg brown areas under L breast in last 2 mo. Not painful or itchy.  Neurological:  Positive for headaches.       Bilat temporal HA 3x weekly resolved with Tylenol or ibuprofen   The following portions of the patient's history were reviewed and updated as appropriate: allergies, current medications, past family history, past medical history, past social history, past surgical history and problem list. Problem list updated.  See flowsheet for other program required questions.  Objective:   Vitals:   07/02/21 1505  BP: 104/68  Weight: 202 lb 9.6 oz (91.9 kg)  Height: 5' 0.25" (1.53 m)    Physical Exam Vitals and nursing note reviewed. Exam conducted with a chaperone present.  Constitutional:      Appearance: Normal appearance. She is obese.  HENT:     Head: Normocephalic and atraumatic.  Cardiovascular:     Rate and Rhythm: Normal rate and regular rhythm.     Heart sounds: Normal heart sounds.  Pulmonary:  Effort: Pulmonary effort is normal.     Breath sounds: Normal breath sounds.  Chest:  Breasts:    Tanner Score is 5.     Right: No inverted nipple or mass.     Left: No inverted nipple or mass.  Abdominal:     General: Bowel sounds are normal.     Palpations: Abdomen is soft.     Hernia: There is no hernia in the left inguinal area or right inguinal area.  Genitourinary:    Exam position: Lithotomy position.     Pubic Area: No rash.      Labia:        Right: No rash or tenderness.        Left: No rash or tenderness.      Vagina: Vaginal discharge present.     Cervix: No cervical motion tenderness or friability.     Uterus: Not  tender.        Comments: 72mm soft mobile clear nontender mass R upper labia Sm amt pink vaginal discharge without odor, pH not assessed due to probable blood IUD strings extending 2cm from cervical os Musculoskeletal:        General: Normal range of motion.  Lymphadenopathy:     Cervical: No cervical adenopathy.     Upper Body:     Right upper body: No supraclavicular, axillary or pectoral adenopathy.     Left upper body: No supraclavicular, axillary or pectoral adenopathy.     Lower Body: No right inguinal adenopathy. No left inguinal adenopathy.  Skin:    General: Skin is warm and dry.     Findings: Lesion present.     Comments: Group of irregular brown patches with well-demarcated but irregular borders under L breast.  Neurological:     General: No focal deficit present.     Mental Status: She is alert.  Psychiatric:        Behavior: Behavior normal.        Thought Content: Thought content normal.        Judgment: Judgment normal.    Assessment and Plan:  Tamara Leach is a 31 y.o. female presenting to the [facility] Department for an initial annual wellness/contraceptive visit  Contraception counseling: Reviewed all forms of birth control options in the tiered based approach. available including abstinence; over the counter/barrier methods; hormonal contraceptive medication including pill, patch, ring, injection,contraceptive implant, ECP; hormonal and nonhormonal IUDs; permanent sterilization options including vasectomy and the various tubal sterilization modalities. Risks, benefits, and typical effectiveness rates were reviewed.  Questions were answered.  Written information was also given to the patient to review.  Patient desires IUD or IUS, this was prescribed for patient.   The patient will follow up in  12 mo for surveillance.  The patient was told to call with any further questions, or with any concerns about this method of contraception.   Emphasized use of condoms 100% of the time for STI prevention.  1. Family planning services Continue Paraguard IUD. - IGP, Aptima HPV  2. Encounter for well woman exam with routine gynecological exam Wet prep clear. Suspect Bartholin or other benign cystic structure. Await all test results and treat if indicated. Consider incision by PCP. - HIV [facility] - Syphilis Serology, [facility] - Chlamydia/Gonorrhea [place] [facility] - WET PREP FOR TRICH, YEAST, CLUE  3. Positive screening for depression on 2-item Patient Health Questionnaire (PHQ-2) PHQ-2 score = 1, PHQ-9 score = 2, offered referral  to [facility] LCSW for counseling, pt desires. Referral completed and contact card given. - Ambulatory referral to Behavioral Health  4. Morbid obesity (HCC) Praised diet/exercise and encouraged ongoing healthy habits.  5. Atypical pigmented skin lesion Encouraged pt to have PCP evaluate pigmented skin lesion under L breast. May be cafe-o-lait spot or other benign finding, but as pt states lesion is new, should have further evaluation.  Return in about 1 year (around 07/02/2022) for Annual well-woman exam.  No future appointments.  Landry Dyke, PA-C

## 2021-07-02 NOTE — Progress Notes (Signed)
Patient here for PE and pap test. Has appointment in March at [facility] to follow-up for possible presyncopal episodes. States her daughter has facial paralysis since last June and has an appointment at [facility] to be seen. Last PE and pap were 04/20/2018, last Pap negative. Paraguard inserted 07/25/2018. Considering pregnancy for 2024.Marland KitchenBurt Knack, RN

## 2021-07-02 NOTE — Progress Notes (Signed)
Wet mount reviewed, no treatment indicated. Patient given [facility] card and dental list..Emberlyn Burlison Babs Sciara, RN

## 2021-07-07 LAB — IGP, APTIMA HPV
HPV Aptima: NEGATIVE
PAP Smear Comment: 0

## 2021-07-09 ENCOUNTER — Encounter: Payer: Self-pay | Admitting: Physician Assistant

## 2021-07-09 DIAGNOSIS — R87619 Unspecified abnormal cytological findings in specimens from cervix uteri: Secondary | ICD-10-CM | POA: Insufficient documentation

## 2021-07-09 DIAGNOSIS — R87611 Atypical squamous cells cannot exclude high grade squamous intraepithelial lesion on cytologic smear of cervix (ASC-H): Secondary | ICD-10-CM | POA: Insufficient documentation

## 2021-07-09 HISTORY — DX: Unspecified abnormal cytological findings in specimens from cervix uteri: R87.619

## 2021-07-13 ENCOUNTER — Telehealth: Payer: Self-pay

## 2021-07-13 NOTE — Telephone Encounter (Signed)
Return call back to patient regarding her PAP results and Colpo referral.  She wanted to calrify her HPV results, which were Negative and did she or did she not have cervical cancer.  I explained to her we were referring her to be evaluated for her RISK for cervical cancer.  Patient verbalizes understanding.  She was at work earlier when I called and could not hear very well.  Language Line used today.  [place] / [phone].  Hart Carwin, RN ? ?

## 2021-07-15 ENCOUNTER — Other Ambulatory Visit: Payer: Self-pay

## 2021-07-15 NOTE — Progress Notes (Signed)
Error

## 2021-07-23 ENCOUNTER — Telehealth: Payer: Self-pay | Admitting: Family Medicine

## 2021-07-23 NOTE — Telephone Encounter (Signed)
I am very confused about my appointments that was made at the hospital. I had an appointment for a colpo? And mammogram. The hospital called and told me both appointments were cancelled and I am wondering what happened because I need them. ? ?

## 2021-07-24 NOTE — Telephone Encounter (Addendum)
Phone call to pt at [phone] with language line interpreter.  ? ?Pt states she really needs to have the colposcopy appts scheduled earlier that in April/May.  She does not understand completely why her March appts were cancelled/rescheduled to begin with.   ? ?This RN stated the the Coordinator would be notified that pt desires colpo appts ASAP and pt wants to know any alternatives to get appt earlier, also prefers to have colpo appts back to back on same day. ? ?Pt counseled that RN does not see any referrals or orders for a mammogram. Pt provided information about what [facility] provider had originally stated: pt to see PCP regarding issue with the skin on her breast (see 07/02/21 office notes). ? ?(RN forwarding phone note to providers and PAP/Colpo Coordinator) ?

## 2021-07-27 NOTE — Telephone Encounter (Signed)
Telephone call to patient today regarding her Colpo referral to [facility].  Clarified with patient that her Colpo appointment had been rescheduled for another time due to scheduling issues at [facility].  New appointment times are:  [facility] 09-02-2021 at 2:30 pm and Colpo 09-19-2031 at 11 am with Dr. Logan Bores at [facility].  Patient is aware of new changes.  She is also aware of PCP referral regarding her Breast referral r/t to skin issue.  Patient reports that [facility].   Language Line used today.  Sarah / [phone].  Hart Carwin, RN ? ?

## 2021-07-29 ENCOUNTER — Telehealth: Payer: Self-pay | Admitting: Family Medicine

## 2021-07-29 ENCOUNTER — Ambulatory Visit: Payer: Self-pay

## 2021-07-29 ENCOUNTER — Encounter: Payer: Self-pay | Admitting: Obstetrics and Gynecology

## 2021-07-29 ENCOUNTER — Ambulatory Visit: Payer: Self-pay | Admitting: Licensed Clinical Social Worker

## 2021-07-29 DIAGNOSIS — Z1331 Encounter for screening for depression: Secondary | ICD-10-CM

## 2021-07-29 NOTE — Progress Notes (Signed)
Counselor Initial Adult Exam ? ?Name: Anaisa Radi [id number] ?Date: 07/29/2021 ?MRN: [id number] ?DOB: 09-15-1990 ?PCP: Caren Macadam, MD ? ?Time spent: 25 minutes  ? ?Reason for Visit /Presenting Problem: LCSW met with patient and reviewed Professional Disclosure Statement. After patient reviewed Behavioral Health Consent, she questioned why she was sent for this appointment. Patient shared she did not need mental health counseling/treatment and that there must have been a misunderstanding with her provider at her last appointment. Session was ended per patient's request.  ? ? ? ?  07/02/2021  ?  3:45 PM  ?Depression screen PHQ 2/9  ?Decreased Interest 0  ?Down, Depressed, Hopeless 0  ?PHQ - 2 Score 0  ?Altered sleeping 0  ?Tired, decreased energy 1  ?Change in appetite 0  ?Feeling bad or failure about yourself  0  ?Trouble concentrating 0  ?Moving slowly or fidgety/restless 0  ?Suicidal thoughts 0  ?PHQ-9 Score 1  ?Difficult doing work/chores Somewhat difficult  ? ? ?Reported Symptoms:   Patient denies mental health symptoms ? ? ?Interpreter used: Jamas Lav [place] ? ?Milton Ferguson, LCSW  ? ?

## 2021-07-29 NOTE — Telephone Encounter (Signed)
Pt. Wants to talk to the nurse or provider that told her the results for the pap/colpo.  ?

## 2021-07-29 NOTE — Telephone Encounter (Signed)
Routed chat message to provider today for guidance.  Will call patient back once I hear from provider.  Dahlia Bailiff, RN ? ?(Copied and pasted chat message from [facility], Jeanella Anton) ? ?Hey Tamara Leach, I had scheduled this patient to come through [facility] for an colposcopy, but our nurse stated since she was hpv negative, she just needs a repeat papsmear in one year. I am going to call her an cancel her [facility] apt. and let her know she can repeat her papsmear [facility]. Thanks! ? ?

## 2021-07-30 NOTE — Telephone Encounter (Signed)
(  Copied and pasted staff message by Donnal Moat, CNM  07-29-2021) ? ?Sciora, Theadora Rama, RN ?Hi Irineo Gaulin, could you please tell [facility] that this pap is ASC-H cannot exclude HGSIL. That's why [facility] referred her for colpo. Thanks  ? ? ? ?

## 2021-07-30 NOTE — Telephone Encounter (Signed)
Chat message has been sent to Tamara Leach at [facility] to inquire about Tamara Leach concerns r/t the Pap's diagnosis and Tamara Leach has forwarded it to her manager at [employer].  Pending decision from [facility].  Will touch base with Tamara Leach again tomorrow.  Hart Carwin, RN ? ?

## 2021-07-31 NOTE — Telephone Encounter (Signed)
Christy to make contact with patient as well to discuss location and protocols.Hart Carwin, RN ? ?

## 2021-07-31 NOTE — Telephone Encounter (Signed)
Telephone call to patient regarding her Colpo referral. Patient was curious as to why her Colpo got cancelled.  I explained to her that it was going to be rescheduled per her provider and [facility].  Per Chat with [facility], Language Line and patient she has been rescheduled for 08-18-2021 at 9:45 am with [facility] and 08-25-2021 with [facility].  Patient given the address and phone number for [facility] and explained to arrive 15 minutes early and if she is late she probably will not be seen.  Language Line used today.  Alexander / [phone].  Hart Carwin, RN ? ?

## 2021-07-31 NOTE — Telephone Encounter (Signed)
Colpo referral has been rescheduled with [facility] for 08-18-2021 at 9:45 am.  Patient aware to arrive 15 minutes early.  Colpo scheduled with [facility] 08-25-2021. Patient aware of appointment date/time.  Language Line used today.  Alexander / [phone].  Dahlia Bailiff, RN ? ?

## 2021-07-31 NOTE — Telephone Encounter (Signed)
Clarification with [facility] this morning per Fonnie Mu (manager at [facility]), patient will need a Colpo and Joellyn Quails is to reach out to patient to reschedule her Colpo.  Neysa Bonito will let me know the new day/time of Colpo. Hart Carwin, RN ? ? ?I will attempt to call patient to let her know what the plan is as well. Hart Carwin, RN ? ? ?

## 2021-08-18 ENCOUNTER — Ambulatory Visit: Payer: Self-pay | Attending: Hematology and Oncology | Admitting: *Deleted

## 2021-08-18 ENCOUNTER — Encounter (INDEPENDENT_AMBULATORY_CARE_PROVIDER_SITE_OTHER): Payer: Self-pay

## 2021-08-18 VITALS — BP 116/70 | HR 66 | Resp 18 | Wt 213.0 lb

## 2021-08-18 DIAGNOSIS — Z1239 Encounter for other screening for malignant neoplasm of breast: Secondary | ICD-10-CM

## 2021-08-18 DIAGNOSIS — R87611 Atypical squamous cells cannot exclude high grade squamous intraepithelial lesion on cytologic smear of cervix (ASC-H): Secondary | ICD-10-CM

## 2021-08-18 NOTE — Patient Instructions (Signed)
Explained breast self awareness with Inocencio Homes. Patient did not need a Pap smear today due to last Pap smear was 07/02/2021. Explained the colposcopy the recommended follow up for her abnormal Pap smear. Referred patient to [facility] for a colposcopy to follow up for her abnormal Pap smear. Appointment scheduled Tuesday, August 25, 2021 at 0955. Patient aware of appointment and will be there. Let patient know a screening mammogram is recommended at age 18 unless clinically indicated prior. Nino Parsley Jazmin Alfonse Ras [place] verbalized understanding. ? ?Sherrey North, Kathaleen Maser, RN ?10:13 AM ? ? ? ? ?

## 2021-08-18 NOTE — Progress Notes (Addendum)
Tamara Leach is a 31 y.o. female who presents to [facility] clinic today with no complaints. Patient referred to BCCCP by the [facility] Department due to having a Pap smear on 07/02/2021 that was abnormal that a colposcopy is recommended for follow up. ?  ?Pap Smear: Pap smear not completed today. Last Pap smear was 07/02/2021 at [facility] clinic and was abnormal - ASC-H with negative HPV . Per patient has no history of an abnormal Pap smear prior to her most recent Pap smear. Last Pap smear result is available in Epic. ?  ?Physical exam: ?Breasts ?Right breast slightly larger than left breast that per patient has not noticed any changes. No skin abnormalities right breast. Observed a darkened area on skin within the left inner lower breast at the bra line that patient stated she has made her PCP aware. Patient stated her PCP told her it should disappear within a few weeks. Patient stated she was told that it is from her bra rubbing. No nipple retraction bilateral breasts. No nipple discharge bilateral breasts. No lymphadenopathy. No lumps palpated bilateral breasts. No complaints of pain or tenderness on exam. Screening mammogram recommended at age 54 unless clinically indicated prior.    ? ?Pelvic/Bimanual ?Pap is not indicated today per BCCCP guidelines. ?  ?Smoking History: ?Patient has never smoked. ?  ?Patient Navigation: ?Patient education provided. Access to services provided for patient through [employer] program. Spanish interpreter Delos Haring from [facility] provided.  ?  ?Breast and Cervical Cancer Risk Assessment: ?Patient does not have family history of breast cancer, known genetic mutations, or radiation treatment to the chest before age 75. Patient does not have history of cervical dysplasia, immunocompromised, or DES exposure in-utero. Breast cancer risk assessment completed. No breast cancer risk calculated due to patient is less than 35-years  old. ? ?Risk Assessment   ? ? Risk Scores   ? ?   08/18/2021  ? Last edited by: Lesle Chris, RN  ? 5-year risk:   ? Lifetime risk:   ? ?  ?  ? ?  ? ? ?A: ?BCCCP exam without pap smear ?No complaints. ? ?P: ?Referred patient to [facility] for a colposcopy to follow up for her abnormal Pap smear. Appointment scheduled Tuesday, August 25, 2021 at 0955. ? ?Priscille Heidelberg, RN ?08/18/2021 10:12 AM   ?

## 2021-08-25 ENCOUNTER — Ambulatory Visit: Payer: Self-pay | Admitting: Obstetrics and Gynecology

## 2021-09-02 ENCOUNTER — Ambulatory Visit: Payer: Self-pay

## 2021-09-18 ENCOUNTER — Encounter: Payer: Self-pay | Admitting: Obstetrics and Gynecology

## 2021-10-07 ENCOUNTER — Other Ambulatory Visit (HOSPITAL_COMMUNITY)
Admission: RE | Admit: 2021-10-07 | Discharge: 2021-10-07 | Disposition: A | Discharge status: Home / Self Care | Payer: Self-pay | Source: Ambulatory Visit | Attending: Obstetrics and Gynecology | Admitting: Obstetrics and Gynecology

## 2021-10-07 ENCOUNTER — Ambulatory Visit (INDEPENDENT_AMBULATORY_CARE_PROVIDER_SITE_OTHER): Payer: Self-pay | Admitting: Obstetrics and Gynecology

## 2021-10-07 ENCOUNTER — Encounter: Payer: Self-pay | Admitting: Obstetrics and Gynecology

## 2021-10-07 VITALS — BP 128/78

## 2021-10-07 DIAGNOSIS — R87611 Atypical squamous cells cannot exclude high grade squamous intraepithelial lesion on cytologic smear of cervix (ASC-H): Secondary | ICD-10-CM

## 2021-10-07 NOTE — Progress Notes (Signed)
Patient given informed consent, signed copy in the chart, time out was performed.  Placed in lithotomy position. Cervix viewed with speculum and colposcope after application of acetic acid.   Colposcopy adequate?  yes Acetowhite lesions?12-3 o'clock Punctation?no Mosaicism?  no Abnormal vasculature?  no Biopsies?yes 2 o'clock ECC?yes  COMMENTS: Patient was given post procedure instructions.  She will return in 2 weeks for results. Discussed benefits of Gardasil vaccine as well  Mora Bellman, MD

## 2021-10-12 LAB — SURGICAL PATHOLOGY

## 2021-10-13 NOTE — Progress Notes (Signed)
Pt aware.

## 2021-10-20 ENCOUNTER — Ambulatory Visit: Payer: Self-pay

## 2021-10-30 ENCOUNTER — Telehealth: Payer: Self-pay

## 2021-10-30 ENCOUNTER — Ambulatory Visit (LOCAL_COMMUNITY_HEALTH_CENTER): Payer: Self-pay

## 2021-10-30 DIAGNOSIS — Z23 Encounter for immunization: Secondary | ICD-10-CM

## 2021-10-30 DIAGNOSIS — Z719 Counseling, unspecified: Secondary | ICD-10-CM

## 2021-10-30 NOTE — Telephone Encounter (Signed)
Via, Tamara Leach, Spanish Interpreter [facility]), Patient informed per Dr. Jolayne Panther, colpo results are consistent with pap results, repeat pap test in 1 year. Patient verbalized understanding.

## 2021-10-30 NOTE — Progress Notes (Signed)
Client seen in nurse clinic for HPV vaccine with no insurance.  [employer] Patient assistance application complete and approved. VIS statement provided.    Interpretation provided by [employer]:     Vaccine administered in left deltoid.  Tolerated well, after vaccine care reviewed.  Date for next apt reviewed.  Zaiden Ludlum Sherrilyn Rist, RN

## 2021-12-02 ENCOUNTER — Ambulatory Visit (LOCAL_COMMUNITY_HEALTH_CENTER): Payer: Self-pay

## 2021-12-02 DIAGNOSIS — Z719 Counseling, unspecified: Secondary | ICD-10-CM

## 2021-12-02 NOTE — Progress Notes (Signed)
In nurse clinic for HPV vaccine. Pt does not have insurance . Pt would be applying for [facility] patient assistance program. Pt did not have the required documentation -check stub or tax form with proof of income. Counseled pt on rescheduling when she can bring in those documents. Pt verbalized understanding. Pt rescheduled for 8/2 .

## 2021-12-09 ENCOUNTER — Ambulatory Visit: Payer: Self-pay

## 2021-12-17 ENCOUNTER — Ambulatory Visit: Payer: Self-pay

## 2021-12-25 ENCOUNTER — Ambulatory Visit (LOCAL_COMMUNITY_HEALTH_CENTER): Payer: Self-pay

## 2021-12-25 DIAGNOSIS — Z719 Counseling, unspecified: Secondary | ICD-10-CM

## 2021-12-25 DIAGNOSIS — Z23 Encounter for immunization: Secondary | ICD-10-CM

## 2021-12-25 NOTE — Progress Notes (Signed)
Pt returned to clinic for 2nd dose HPV vaccine. Pt remains uninsured, sent [employer] Patient assistance application, completed and approved. Administered vaccine and tolerated well. Given VIS, and updated NCIR. Spanish interpretation provided by [facility] ID # [id number] in [place] language line, also use [place]. M.Ethelreda Sukhu, LPN.

## 2022-01-02 ENCOUNTER — Other Ambulatory Visit: Payer: Self-pay

## 2022-01-02 ENCOUNTER — Encounter: Payer: Self-pay | Admitting: Emergency Medicine

## 2022-01-02 DIAGNOSIS — M5441 Lumbago with sciatica, right side: Secondary | ICD-10-CM | POA: Insufficient documentation

## 2022-01-02 DIAGNOSIS — J45909 Unspecified asthma, uncomplicated: Secondary | ICD-10-CM | POA: Insufficient documentation

## 2022-01-02 LAB — COMPREHENSIVE METABOLIC PANEL
ALT: 49 U/L — ABNORMAL HIGH (ref 0–44)
AST: 41 U/L (ref 15–41)
Albumin: 4.4 g/dL (ref 3.5–5.0)
Alkaline Phosphatase: 68 U/L (ref 38–126)
Anion gap: 7 (ref 5–15)
BUN: 17 mg/dL (ref 6–20)
CO2: 26 mmol/L (ref 22–32)
Calcium: 9.4 mg/dL (ref 8.9–10.3)
Chloride: 105 mmol/L (ref 98–111)
Creatinine, Ser: 0.82 mg/dL (ref 0.44–1.00)
GFR, Estimated: >60 mL/min (ref >60)
Glucose, Bld: 98 mg/dL (ref 70–99)
Potassium: 3.9 mmol/L (ref 3.5–5.1)
Sodium: 138 mmol/L (ref 135–145)
Total Bilirubin: 1.2 mg/dL (ref 0.3–1.2)
Total Protein: 7.9 g/dL (ref 6.5–8.1)

## 2022-01-02 LAB — CBC
HCT: 40 % (ref 36.0–46.0)
Hemoglobin: 13.3 g/dL (ref 12.0–15.0)
MCH: 31 pg (ref 26.0–34.0)
MCHC: 33.3 g/dL (ref 30.0–36.0)
MCV: 93.2 fL (ref 80.0–100.0)
Platelets: 230 10*3/uL (ref 150–400)
RBC: 4.29 MIL/uL (ref 3.87–5.11)
RDW: 11.8 % (ref 11.5–15.5)
WBC: 6.9 10*3/uL (ref 4.0–10.5)
nRBC: 0 % (ref 0.0–0.2)

## 2022-01-02 NOTE — ED Triage Notes (Signed)
Pt reports woke up this morning with back pain, denies any injury to area. Pt reports she is not able to stand, or walk due to severe pain. Pt reports tingling sensation to right foot. Pt talks in complete sentences no respiratory distress noted

## 2022-01-03 ENCOUNTER — Emergency Department
Admission: EM | Admit: 2022-01-03 | Discharge: 2022-01-03 | Disposition: A | Discharge status: Home / Self Care | Payer: Self-pay | Attending: Emergency Medicine | Admitting: Emergency Medicine

## 2022-01-03 DIAGNOSIS — M5431 Sciatica, right side: Secondary | ICD-10-CM

## 2022-01-03 LAB — URINALYSIS, ROUTINE W REFLEX MICROSCOPIC
Bilirubin Urine: NEGATIVE
Glucose, UA: NEGATIVE mg/dL
Ketones, ur: NEGATIVE mg/dL
Leukocytes,Ua: NEGATIVE
Nitrite: NEGATIVE
Protein, ur: NEGATIVE mg/dL
Specific Gravity, Urine: 1.011 (ref 1.005–1.030)
pH: 6 (ref 5.0–8.0)

## 2022-01-03 MED ORDER — HYDROCODONE-ACETAMINOPHEN 5-325 MG PO TABS: 1.0000 | ORAL_TABLET | Freq: Four times a day (QID) | ORAL | 0 refills | Status: AC | PRN

## 2022-01-03 MED ORDER — HYDROCODONE-ACETAMINOPHEN 5-325 MG PO TABS
1.0000 | ORAL_TABLET | Freq: Once | ORAL | Status: AC
  Administered 2022-01-03: 1 via ORAL
  Filled 2022-01-03: qty 1

## 2022-01-03 MED ORDER — KETOROLAC TROMETHAMINE 60 MG/2ML IM SOLN
30.0000 mg | Freq: Once | INTRAMUSCULAR | Status: AC
  Administered 2022-01-03: 30 mg via INTRAMUSCULAR
  Filled 2022-01-03: qty 2

## 2022-01-03 MED ORDER — METHYLPREDNISOLONE 4 MG PO TBPK: ORAL_TABLET | ORAL | 0 refills | Status: AC

## 2022-01-03 MED ORDER — DIAZEPAM 2 MG PO TABS
2.0000 mg | ORAL_TABLET | Freq: Once | ORAL | Status: AC
  Administered 2022-01-03: 2 mg via ORAL
  Filled 2022-01-03: qty 1

## 2022-01-03 MED ORDER — DIAZEPAM 2 MG PO TABS: 2.0000 mg | ORAL_TABLET | Freq: Three times a day (TID) | ORAL | 0 refills | Status: AC | PRN

## 2022-01-03 NOTE — ED Provider Notes (Signed)
[facility] Provider Note    Event Date/Time   First MD Initiated Contact with Patient 01/03/22 [phone]     (approximate)   History   Back Pain   HPI  History obtained via tele-Spanish interpreter  Tamara Leach is a 31 y.o. female who presents to the ED from home with a chief complaint of back pain.  Patient awoke yesterday morning with lower back pain, right worse than left.  She works in [employer].  Denies injury.  Endorses muscle spasms and painful to walk.  Reports tingling sensation to both feet, right greater than left.  Denies fever, chest pain, shortness of breath, abdominal pain, nausea, vomiting or dysuria.  Denies bowel or bladder incontinence.  Denies extremity weakness     Past Medical History   Past Medical History:  Diagnosis Date  . Abnormal Pap smear of cervix 07/09/2021  . Asthma      Active Problem List   Patient Active Problem List   Diagnosis Date Noted  . Atypical squamous cells cannot exclude high grade squamous intraepithelial lesion on cytologic smear of cervix (ASC-H) 07/09/2021  . Positive screening for depression on 2-item Patient Health Questionnaire (PHQ-2) 07/02/2021  . Atypical pigmented skin lesion 07/02/2021  . 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 Hidradenitis suppurativa 04/20/2018  . Morbid obesity (HCC) 04/20/2018     Past Surgical History   Past Surgical History:  Procedure Laterality Date  . APPENDECTOMY    . CESAREAN SECTION       Home Medications   Prior to Admission medications   Medication Sig Start Date End Date Taking? Authorizing Provider  ibuprofen (ADVIL,MOTRIN) 600 MG tablet Take 1 tablet (600 mg total) by mouth every 8 (eight) hours as needed. Patient not taking: Reported on 07/02/2021 11/14/17   Merrily Brittle, MD  [facility] INTRAUTERINE COPPER IU by Intrauterine route. 07/25/18   Tessa Lerner, NP     Allergies  Patient has no known allergies.   Family  History   Family History  Problem Relation Age of Onset  . Diabetes Mother   . Diabetes Father   . Migraines Brother   . Cancer Maternal Grandmother   . Breast cancer Neg Hx      Physical Exam  Triage Vital Signs: ED Triage Vitals  Enc Vitals Group     BP 01/02/22 1959 114/82     Pulse Rate 01/02/22 1959 (!) 58     Resp 01/02/22 1959 16     Temp 01/02/22 1959 98.2 F (36.8 C)     Temp Source 01/02/22 1959 Oral     SpO2 01/02/22 1959 100 %     Weight 01/02/22 2001 215 lb (97.5 kg)     Height 01/02/22 2001 5\' 1"  (1.549 m)     Head Circumference --      Peak Flow --      Pain Score 01/02/22 2001 9     Pain Loc --      Pain Edu? --      Excl. in GC? --     Updated Vital Signs: BP 113/61 (BP Location: Right Arm)   Pulse (!) 56   Temp 97.8 F (36.6 C) (Oral)   Resp 16   Ht 5\' 1"  (1.549 m)   Wt 97.5 kg   LMP 12/29/2021 (Approximate)   SpO2 98%   BMI 40.62 kg/m    General: Awake, mild distress.  CV:  Bradycardic.  Good peripheral perfusion.  Resp:  Normal effort.  CTA B. Abd:  Nontender to light or deep palpation.  No distention.  Other:  No spinal tenderness to palpation.  Right lumbar paraspinal muscle spasms.  Positive straight leg raise.  5/5 motor strength and sensation bilateral lower extremities.   ED Results / Procedures / Treatments  Labs (all labs ordered are listed, but only abnormal results are displayed) Labs Reviewed  COMPREHENSIVE METABOLIC PANEL - Abnormal; Notable for the following components:      Result Value   ALT 49 (*)    All other components within normal limits  URINALYSIS, ROUTINE W REFLEX MICROSCOPIC - Abnormal; Notable for the following components:   Color, Urine YELLOW (*)    APPearance CLEAR (*)    Hgb urine dipstick MODERATE (*)    Bacteria, UA MANY (*)    All other components within normal limits  CBC     EKG  None   RADIOLOGY None   Official radiology report(s): No results found.   PROCEDURES:  Critical  Care performed: No  Procedures   MEDICATIONS ORDERED IN ED: Medications  ketorolac (TORADOL) injection 30 mg (30 mg Intramuscular Given 01/03/22 0105)  HYDROcodone-acetaminophen (NORCO/VICODIN) 5-325 MG per tablet 1 tablet (1 tablet Oral Given 01/03/22 0106)  diazepam (VALIUM) tablet 2 mg (2 mg Oral Given 01/03/22 0107)     IMPRESSION / MDM / ASSESSMENT AND PLAN / ED COURSE  I reviewed the triage vital signs and the nursing notes.                             31 year old female presenting with lower back pain.  Differential diagnosis includes but is not limited to musculoskeletal strain, sciatica, UTI, kidney stone, AAA, etc.  I have personally reviewed patient's records and see that she had a PCP office visit for routine physical on 07/02/2021.  No focal neurological deficits noted on examination.  Will administer IM ketorolac, oral Norco and Valium.  Will reassess.  Patient's presentation is most consistent with acute, uncomplicated illness.  0244 UA negative. Pain significantly improved. Will discharge home on Medrol Dosepak, NSAIDs, analgesia and muscle relaxer. Patient will follow-up with orthopedics. Strict return precautions given. Patient verbalizes understanding and agrees with plan of care.  FINAL CLINICAL IMPRESSION(S) / ED DIAGNOSES   Final diagnoses:  Sciatica of right side     Rx / DC Orders   ED Discharge Orders     None        Note:  This document was prepared using Dragon voice recognition software and may include unintentional dictation errors.   Irean Hong, MD 01/03/22 [phone]

## 2022-01-03 NOTE — Discharge Instructions (Signed)
1.  Take steroid taper as prescribed (Medrol Dosepak). 2.  You may take medicines as needed for pain and muscle spasms over-the-counter Ibuprofen, (Norco/Valium #15). 3.  Return to the ER for worsening symptoms, persistent vomiting, difficulty breathing or other concerns.

## 2022-01-06 ENCOUNTER — Ambulatory Visit: Payer: Self-pay

## 2022-01-08 ENCOUNTER — Ambulatory Visit: Payer: Self-pay

## 2022-01-08 ENCOUNTER — Ambulatory Visit: Payer: Self-pay | Admitting: Nurse Practitioner

## 2022-01-08 ENCOUNTER — Encounter: Payer: Self-pay | Admitting: Nurse Practitioner

## 2022-01-08 DIAGNOSIS — E039 Hypothyroidism, unspecified: Secondary | ICD-10-CM | POA: Insufficient documentation

## 2022-01-08 DIAGNOSIS — Z113 Encounter for screening for infections with a predominantly sexual mode of transmission: Secondary | ICD-10-CM

## 2022-01-08 LAB — HM HIV SCREENING LAB: HM HIV Screening: NEGATIVE

## 2022-01-08 NOTE — Progress Notes (Signed)
[facility] Department  STI clinic/screening visit [address] [place] [address] [phone]  Subjective:  Tamara Leach is a 31 y.o. female being seen today for an STI screening visit. The patient reports they do have symptoms.  Patient reports that they do not desire a pregnancy in the next year.   They reported they are not interested in discussing contraception today.  Patient currently has an IUD.   Patient's last menstrual period was 12/29/2021 (approximate).   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    Last HIV test per patient/review of record was 2/23. Patient reports last pap was 2/23.   Screening for MPX risk: Does the patient have an unexplained rash? No Is the patient MSM? No Does the patient endorse multiple sex partners or anonymous sex partners? No Did the patient have close or sexual contact with a person diagnosed with MPX? No Has the patient traveled outside the [place] where MPX is endemic? No Is there a high clinical suspicion for MPX-- evidenced by one of the following No  -Unlikely to be chickenpox  -Lymphadenopathy  -Rash  that present in same phase of evolution on any given body part See flowsheet for further details and programmatic requirements.   Immunization history:  Immunization History  Administered Date(s) Administered   HPV 9-valent 10/30/2021, 12/25/2021   PFIZER(Purple Top)SARS-COV-2 Vaccination 08/05/2019, 08/26/2019     The following portions of the patient's history were reviewed and updated as appropriate: allergies, current medications, past medical history, past social history, past surgical history and problem list.  Objective:  There were no vitals filed for this visit.  Physical Exam Constitutional:      Appearance: Normal appearance.  HENT:     Head: Normocephalic. No abrasion, masses or laceration. Hair is normal.     Right Ear: External ear normal.     Left Ear: External ear normal.     Nose: Nose normal.     Mouth/Throat:     Lips: Pink.     Mouth: Mucous membranes are moist. No oral lesions.     Pharynx: No oropharyngeal exudate or posterior oropharyngeal erythema.     Tonsils: No tonsillar exudate or tonsillar abscesses.     Comments: No visible dental caries noted  Eyes:     General: Lids are normal.        Right eye: No discharge.        Left eye: No discharge.     Conjunctiva/sclera: Conjunctivae normal.     Right eye: No exudate.    Left eye: No exudate. Abdominal:     General: Abdomen is flat.     Palpations: Abdomen is soft.     Tenderness: There  is no abdominal tenderness. There is no rebound.  Genitourinary:    Pubic Area: No rash or pubic lice.      Labia:        Right: No rash, tenderness, lesion or injury.        Left: No rash, tenderness, lesion or injury.      Vagina: Normal. No vaginal discharge, erythema or lesions.     Cervix: No cervical motion tenderness, discharge, lesion or erythema.     Uterus: Not enlarged and not tender.      Rectum: Normal.     Comments: Amount Discharge: small  Odor: No pH: less than 4.5 Adheres to vaginal wall:  No Color: color of discharge matches the Jazarah Capili swab  IUD strings noted  Musculoskeletal:     Cervical back: Full passive range of motion without pain, normal range of motion and neck supple.  Lymphadenopathy:     Cervical: No cervical adenopathy.     Right cervical: No superficial, deep or posterior cervical adenopathy.    Left cervical: No superficial, deep or posterior cervical adenopathy.     Upper Body:     Right upper body: No supraclavicular, axillary or epitrochlear adenopathy.     Left upper body: No supraclavicular, axillary or epitrochlear adenopathy.     Lower Body: No right inguinal adenopathy. No left inguinal adenopathy.  Skin:    General: Skin is warm and dry.     Findings: No lesion or rash.  Neurological:     Mental Status: She is alert and oriented to person, place, and time.  Psychiatric:        Attention and Perception: Attention normal.        Mood and Affect: Mood normal.        Speech: Speech normal.        Behavior: Behavior normal. Behavior is cooperative.      Assessment and Plan:  [name] is a 31 y.o. female presenting to the [facility] Department for STI screening  1. Screening examination for venereal disease -31 year old female in clinic today for STD screening. -Patient accepted all screenings including oral, vaginal, rectal CT/GC, wet prep and bloodwork for HIV/RPR.  Patient meets criteria for HepB screening? No. Ordered? No - low risk Patient meets criteria for HepC screening? No. Ordered? No - low risk   Treat wet prep per standing order Discussed time line for State Lab results and that patient will be called with positive results and encouraged patient to call if she had not heard in 2 weeks.  Counseled to return or seek care for continued or worsening symptoms Recommended condom use with all sex  Patient is currently using  IUD   to prevent pregnancy.    - HIV [place] [facility] - Syphilis Serology, [place]  Lab - Chlamydia/Gonorrhea [place] [facility] - Chlamydia/Gonorrhea [place] [facility] - Chlamydia/Gonorrhea [place] [facility] - WET PREP FOR TRICH, YEAST, CLUE   Total time spent: 30 minutes   Return if symptoms worsen or fail to improve.    Glenna Fellows, FNP

## 2022-01-08 NOTE — Progress Notes (Signed)
Pt here for STD screening.  Wet mount results reviewed, no treatment required per SO.  Condoms declined.  Quinton Voth M Michaell Grider, RN  

## 2022-01-12 LAB — WET PREP FOR TRICH, YEAST, CLUE
Trichomonas Exam: NEGATIVE
Yeast Exam: NEGATIVE

## 2022-01-19 ENCOUNTER — Telehealth: Payer: Self-pay | Admitting: Family Medicine

## 2022-01-19 NOTE — Telephone Encounter (Signed)
Password verified.  Pt notified of negative STD results from 01/08/2022, which include HIV, Syphilis and GC/CT x 3.

## 2022-06-08 ENCOUNTER — Ambulatory Visit: Payer: Self-pay

## 2022-07-05 ENCOUNTER — Ambulatory Visit (LOCAL_COMMUNITY_HEALTH_CENTER): Payer: Self-pay | Admitting: Family Medicine

## 2022-07-05 ENCOUNTER — Encounter: Payer: Self-pay | Admitting: Family Medicine

## 2022-07-05 VITALS — BP 107/69 | HR 70 | Ht 61.0 in | Wt 212.0 lb

## 2022-07-05 DIAGNOSIS — R87611 Atypical squamous cells cannot exclude high grade squamous intraepithelial lesion on cytologic smear of cervix (ASC-H): Secondary | ICD-10-CM

## 2022-07-05 DIAGNOSIS — Z113 Encounter for screening for infections with a predominantly sexual mode of transmission: Secondary | ICD-10-CM

## 2022-07-05 DIAGNOSIS — Z309 Encounter for contraceptive management, unspecified: Secondary | ICD-10-CM

## 2022-07-05 DIAGNOSIS — Z01419 Encounter for gynecological examination (general) (routine) without abnormal findings: Secondary | ICD-10-CM

## 2022-07-05 LAB — WET PREP FOR TRICH, YEAST, CLUE
Trichomonas Exam: NEGATIVE
Yeast Exam: NEGATIVE

## 2022-07-05 LAB — HM HIV SCREENING LAB: HM HIV Screening: NEGATIVE

## 2022-07-05 NOTE — Progress Notes (Signed)
Pt is here for a PE, Pap smear and STD testing.  Wet mount results reviewed, no treatment required per SO.  FP given to patient.   Pt is requesting HPV #3 and Amy Widderich spoke with the patient about financial assitance and time for scheduling. Windle Guard, RN

## 2022-07-05 NOTE — Progress Notes (Addendum)
[facility] Number: [phone]  Family Planning Visit- Repeat Yearly Visit  Subjective:  Finn Weins is a 32 y.o. [id number]  being seen today for an annual wellness visit and to discuss contraception options.   The patient is currently using IUD or IUS for pregnancy prevention. Patient does not want a pregnancy in the next year.    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  Chief Complaint  Patient presents with   Contraception    Physical and pap STD screening  would like to have her A1c checked    Patient reports to clinic for repeat pap test and PE  See flowsheet for other program required questions.   Body mass index is 40.06 kg/m. - Patient is eligible for diabetes screening based on BMI> 25 and age >35?  no HA1C ordered? yes  Patient reports 1 of partners in last year. Desires STI screening?  Yes   Has patient been screened once for HCV in the past?  No  No results found for: "HCVAB"  Does the patient have current of drug use, have a partner with drug use, and/or has been incarcerated since last result? No  If yes-- Screen for HCV through [facility]   Does the patient meet criteria for HBV testing? No  Criteria:  -Household, sexual or needle sharing contact with HBV -History of drug use -HIV positive -Those with known Hep C   Health Maintenance Due  Topic Date Due   Hepatitis C Screening  Never done   DTaP/Tdap/Td (1 - Tdap)  Never done   INFLUENZA VACCINE  Never done   COVID-19 Vaccine (3 - 2023-24 season) 01/08/2022   HPV VACCINES (3 - 3-dose SCDM series) 05/01/2022   PAP SMEAR-Modifier  07/02/2022    Review of Systems  Constitutional:  Negative for weight loss.  Eyes:  Negative for blurred vision.  Respiratory:  Negative for cough and shortness of breath.   Cardiovascular:  Negative for claudication.  Gastrointestinal:  Negative for nausea.  Genitourinary:  Positive for frequency. Negative for dysuria.  Musculoskeletal:  Positive for myalgias.       Sciatic type pain down the leg  Skin:  Negative for rash.  Neurological:  Positive for headaches.  Endo/Heme/Allergies:  Does not bruise/bleed easily.    The following portions of the patient's history were reviewed and updated as appropriate: allergies, current medications, past family history, past medical history, past social history, past surgical history and problem list. Problem list updated.  Objective:   Vitals:   07/05/22 1454  BP: 107/69  Pulse: 70  Weight: 212 lb (96.2 kg)  Height: '5\' 1"'$  (1.549 m)    Physical Exam Vitals and nursing note reviewed.  Constitutional:      Appearance: She is obese.  HENT:     Head: Normocephalic and atraumatic.     Mouth/Throat:     Mouth: Mucous membranes are moist.     Pharynx: Oropharynx is clear. No oropharyngeal exudate or posterior oropharyngeal erythema.  Pulmonary:     Effort: Pulmonary effort is normal.  Abdominal:  Palpations: Abdomen is soft. There is no mass.     Tenderness: There is no abdominal tenderness. There is no rebound.  Genitourinary:    General: Normal vulva.     Exam position: Lithotomy position.     Pubic Area: No rash or pubic lice.      Labia:        Right: No rash or lesion.        Left: No rash or lesion.      Vagina: Normal. No vaginal discharge, erythema, bleeding or lesions.     Cervix: No cervical motion tenderness, discharge, friability, lesion or erythema.      Uterus: Normal.      Adnexa: Right adnexa normal and left adnexa normal.     Rectum: Normal.     Comments: pH = 4  IUD strings visualized at cervical os Lymphadenopathy:     Head:     Right side of head: No preauricular or posterior auricular adenopathy.     Left side of head: No preauricular or posterior auricular adenopathy.     Cervical: No cervical adenopathy.     Upper Body:     Right upper body: No supraclavicular, axillary or epitrochlear adenopathy.     Left upper body: No supraclavicular, axillary or epitrochlear adenopathy.     Lower Body: No right inguinal adenopathy. No left inguinal adenopathy.  Skin:    General: Skin is warm and dry.     Findings: No rash.  Neurological:     Mental Status: She is alert and oriented to person, place, and time.       Assessment and Plan:  Chua Savoy Nikia Weingart is a 32 y.o. female [phone] presenting to the [facility] Department for an yearly wellness and contraception visit   Contraception counseling: Reviewed options based on patient desire and reproductive life plan. Patient is interested in IUD or IUS. This was not provided to the patient today. IUD already in place.  Risks, benefits, and typical effectiveness rates were reviewed.  Questions were answered.  Written information was also given to the patient to review.    The patient will follow up in  1 years for surveillance.  The patient was told to call with any further questions, or with any concerns about this method of contraception.  Emphasized use of condoms 100% of the time for STI prevention.  Patient was assessed for need for ECP. Not indicated, IUD in place  1. Well woman exam with routine gynecological exam Pt c/o intolerance to heat, difficulty losing weight, headaches, vaginal discharge, frequent urination and pain that goes from her gluteus maximus to her calf  -pt has a pcp at [facility], who is following her for thyroid- she said she was  prescribed medication for this and is taking it daily, encouraged continued f/u for her thyroid but to also ask pcp about HA- as these are happening daily for the last 2 months- encouraged to keep headache diary -Pt's pain in her leg appears to be sciatic pain- pt is significantly overweight- encouraged continued weight loss and exercise -testing today for DM with A1c  - IGP, Aptima HPV - Hgb A1c w/o eAG - WET PREP FOR TRICH, YEAST, CLUE  2. Screening for venereal disease  - Chlamydia/Gonorrhea [place] Lab - HIV [place] [facility] - Syphilis Serology, [place] [facility]  3. Atypical squamous cells cannot exclude high grade squamous intraepithelial lesion on cytologic smear of cervix (ASC-H) -colpo in 10/07/21, repeat pap  today  - IGP, Aptima HPV     Return if symptoms worsen or fail to improve.  Due to language barrier, interpreter Marlene [place] was present for this visit.   Sharlet Salina, [place]

## 2022-07-06 LAB — HGB A1C W/O EAG: Hgb A1c MFr Bld: 5.5 % (ref 4.8–5.6)

## 2022-07-14 LAB — IGP, APTIMA HPV
HPV Aptima: NEGATIVE
PAP Smear Comment: 0

## 2022-07-14 LAB — SPECIMEN STATUS REPORT

## 2022-07-15 ENCOUNTER — Ambulatory Visit: Payer: Self-pay

## 2022-07-15 ENCOUNTER — Ambulatory Visit (LOCAL_COMMUNITY_HEALTH_CENTER): Payer: Self-pay

## 2022-07-15 DIAGNOSIS — Z719 Counseling, unspecified: Secondary | ICD-10-CM

## 2022-07-15 NOTE — Progress Notes (Signed)
Client seen in nurse clinic for 3rd HPV vaccine.  Interpretation provided by [employer]  [phone].  Application sent to [facility] vaccine assistance for approval, but approval was not granted because she only provided 2 weeks of paystubs.  [facility] assistance request she send two more.  Her employer was contacted by phone, and will e-mail her 2 more weeks of pay.  Client is not able to wait any longer for this appointment.  Client will provider [employer] with additional 2 weeks of paystub and we will reschedule for another day for her to receive the vaccine.    No vaccine was given today.   Gillis Boardley Shelda Pal, RN
